# Patient Record
Sex: Female | Born: 1990 | Race: White | Hispanic: No | Marital: Married | Smoking: Never smoker
Health system: Southern US, Community
[De-identification: ages and names within clinical notes are randomized; demographics above are authoritative.]

## PROBLEM LIST (undated history)

## (undated) DIAGNOSIS — M75100 Unspecified rotator cuff tear or rupture of unspecified shoulder, not specified as traumatic: Secondary | ICD-10-CM

## (undated) DIAGNOSIS — D693 Immune thrombocytopenic purpura: Secondary | ICD-10-CM

## (undated) HISTORY — PX: WISDOM TOOTH EXTRACTION: SHX21

---
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
## 2008-04-04 ENCOUNTER — Ambulatory Visit: Payer: Self-pay | Admitting: Orthopedic Surgery

## 2008-04-04 ENCOUNTER — Telehealth: Payer: Self-pay | Admitting: Orthopedic Surgery

## 2008-04-04 DIAGNOSIS — M549 Dorsalgia, unspecified: Secondary | ICD-10-CM | POA: Insufficient documentation

## 2008-04-04 DIAGNOSIS — M5430 Sciatica, unspecified side: Secondary | ICD-10-CM

## 2008-04-04 DIAGNOSIS — IMO0002 Reserved for concepts with insufficient information to code with codable children: Secondary | ICD-10-CM

## 2008-04-10 ENCOUNTER — Encounter (HOSPITAL_COMMUNITY): Admission: RE | Admit: 2008-04-10 | Discharge: 2008-05-10 | Payer: Self-pay | Admitting: Orthopedic Surgery

## 2008-04-10 ENCOUNTER — Encounter: Payer: Self-pay | Admitting: Orthopedic Surgery

## 2008-05-11 ENCOUNTER — Encounter (HOSPITAL_COMMUNITY): Admission: RE | Admit: 2008-05-11 | Discharge: 2008-05-18 | Payer: Self-pay | Admitting: Orthopedic Surgery

## 2008-05-17 ENCOUNTER — Encounter: Payer: Self-pay | Admitting: Orthopedic Surgery

## 2008-08-01 ENCOUNTER — Ambulatory Visit (HOSPITAL_COMMUNITY): Admission: RE | Admit: 2008-08-01 | Discharge: 2008-08-01 | Payer: Self-pay | Admitting: Pulmonary Disease

## 2008-09-22 ENCOUNTER — Emergency Department (HOSPITAL_COMMUNITY): Admission: EM | Admit: 2008-09-22 | Discharge: 2008-09-23 | Payer: Self-pay | Admitting: Emergency Medicine

## 2008-11-01 ENCOUNTER — Ambulatory Visit (HOSPITAL_COMMUNITY): Admission: RE | Admit: 2008-11-01 | Discharge: 2008-11-01 | Payer: Self-pay | Admitting: Pulmonary Disease

## 2008-11-08 ENCOUNTER — Encounter (INDEPENDENT_AMBULATORY_CARE_PROVIDER_SITE_OTHER): Payer: Self-pay | Admitting: Pulmonary Disease

## 2008-11-08 ENCOUNTER — Ambulatory Visit (HOSPITAL_COMMUNITY): Admission: RE | Admit: 2008-11-08 | Discharge: 2008-11-08 | Payer: Self-pay | Admitting: Pulmonary Disease

## 2008-11-08 ENCOUNTER — Ambulatory Visit: Payer: Self-pay | Admitting: Cardiology

## 2009-04-15 ENCOUNTER — Ambulatory Visit (HOSPITAL_COMMUNITY): Admission: RE | Admit: 2009-04-15 | Discharge: 2009-04-15 | Payer: Self-pay | Admitting: Internal Medicine

## 2009-07-07 ENCOUNTER — Emergency Department (HOSPITAL_COMMUNITY): Admission: EM | Admit: 2009-07-07 | Discharge: 2009-07-07 | Payer: Self-pay | Admitting: Emergency Medicine

## 2010-06-03 ENCOUNTER — Ambulatory Visit (INDEPENDENT_AMBULATORY_CARE_PROVIDER_SITE_OTHER): Payer: BC Managed Care – PPO | Admitting: Internal Medicine

## 2010-06-03 DIAGNOSIS — R112 Nausea with vomiting, unspecified: Secondary | ICD-10-CM

## 2010-06-03 DIAGNOSIS — R1031 Right lower quadrant pain: Secondary | ICD-10-CM

## 2010-06-03 DIAGNOSIS — R1032 Left lower quadrant pain: Secondary | ICD-10-CM

## 2010-06-05 ENCOUNTER — Other Ambulatory Visit (INDEPENDENT_AMBULATORY_CARE_PROVIDER_SITE_OTHER): Payer: Self-pay | Admitting: Internal Medicine

## 2010-06-05 DIAGNOSIS — R110 Nausea: Secondary | ICD-10-CM

## 2010-06-06 ENCOUNTER — Encounter (HOSPITAL_COMMUNITY)
Admission: RE | Admit: 2010-06-06 | Discharge: 2010-06-06 | Disposition: A | Discharge status: Home / Self Care | Payer: BC Managed Care – PPO | Source: Ambulatory Visit | Attending: Internal Medicine | Admitting: Internal Medicine

## 2010-06-06 ENCOUNTER — Encounter (HOSPITAL_COMMUNITY): Payer: Self-pay

## 2010-06-06 DIAGNOSIS — R109 Unspecified abdominal pain: Secondary | ICD-10-CM | POA: Insufficient documentation

## 2010-06-06 DIAGNOSIS — R110 Nausea: Secondary | ICD-10-CM | POA: Insufficient documentation

## 2010-06-06 MED ORDER — TECHNETIUM TC 99M MEBROFENIN IV KIT
5.0000 | PACK | Freq: Once | INTRAVENOUS | Status: AC | PRN
  Administered 2010-06-06: 4.67 via INTRAVENOUS

## 2010-07-05 LAB — BLOOD GAS, ARTERIAL
Bicarbonate: 24.7 mEq/L — ABNORMAL HIGH (ref 20.0–24.0)
O2 Saturation: 96.1 %
pCO2 arterial: 38.5 mmHg (ref 35.0–45.0)
pO2, Arterial: 63.2 mmHg — ABNORMAL LOW (ref 80.0–100.0)

## 2010-07-07 LAB — DIFFERENTIAL
Basophils Absolute: 0 10*3/uL (ref 0.0–0.1)
Basophils Relative: 0 % (ref 0–1)
Eosinophils Absolute: 0.1 10*3/uL (ref 0.0–0.7)
Neutrophils Relative %: 69 % (ref 43–77)

## 2010-07-07 LAB — BASIC METABOLIC PANEL
BUN: 15 mg/dL (ref 6–23)
Creatinine, Ser: 0.83 mg/dL (ref 0.4–1.2)
GFR calc Af Amer: >60 mL/min (ref >60)
Potassium: 3.4 mEq/L — ABNORMAL LOW (ref 3.5–5.1)
Sodium: 136 mEq/L (ref 135–145)

## 2010-07-07 LAB — CBC
MCHC: 36.3 g/dL — ABNORMAL HIGH (ref 30.0–36.0)
MCV: 90.2 fL (ref 78.0–100.0)
Platelets: 250 10*3/uL (ref 150–400)
RDW: 12 % (ref 11.5–15.5)

## 2010-07-07 LAB — D-DIMER, QUANTITATIVE: D-Dimer, Quant: 0.63 ug/mL-FEU — ABNORMAL HIGH (ref 0.00–0.48)

## 2010-08-12 NOTE — Procedures (Signed)
NAMELEORA, PLATT             ACCOUNT NO.:  [id number]   MEDICAL RECORD NO.:  [id number]          PATIENT TYPE:  OUT   LOCATION:  RESP                          FACILITY:  [facility]   PHYSICIAN:  Edward L. Juanetta Gosling, M.D.DATE OF BIRTH:  February 02, 1991   DATE OF PROCEDURE:  DATE OF DISCHARGE:  11/01/2008                            PULMONARY FUNCTION TEST   1. Spirometry is normal.  2. Lung volumes are normal.  3. DLCO is normal.  4. Arterial blood gas shows relative resting hypoxemia.      Edward L. Juanetta Gosling, M.D.  Electronically Signed     ELH/MEDQ  D:  11/02/2008  T:  11/02/2008  Job:  [id number]

## 2010-08-15 NOTE — Op Note (Signed)
NAME:  Gina Vasquez, Gina Vasquez                       ACCOUNT NO.:  [id number]   MEDICAL RECORD NO.:  [id number]                   PATIENT TYPE:  AMB   LOCATION:  DSC                                  FACILITY:  [facility]   PHYSICIAN:  Loreta Ave, M.D.              DATE OF BIRTH:  1990/10/13   DATE OF PROCEDURE:  07/05/2003  DATE OF DISCHARGE:                                 OPERATIVE REPORT   PREOPERATIVE DIAGNOSIS:  Symptomatic ganglion, dorsal aspect of the right  wrist.   POSTOPERATIVE DIAGNOSIS:  Symptomatic ganglion, dorsal aspect of the right  wrist, being subretinacular fourth dorsal compartment.   PROCEDURE:  Excision ganglion dorsal aspect of the right wrist.   SURGEON:  Loreta Ave, M.D.   ASSISTANT:  Arlys John D. Petrarca, P.A.-C.   ANESTHESIA:  General.   ESTIMATED BLOOD LOSS:  Minimal.   TOURNIQUET TIME:  30 minutes.   SPECIMENS:  None.   CULTURES:  None.   COMPLICATIONS:  None.   DRESSINGS:  Soft compressive with bulky dressing and splint.   PROCEDURE:  The patient was brought to the operating room and after adequate  anesthesia had been obtained, the tourniquet was applied to the upper aspect  of the right arm.  Prepped and draped in the usual sterile fashion.  Exsanguinated with elevation of Esmarch and the tourniquet inflated to 250  mmHg.  A transverse incision over the ganglion over the dorsal aspect of the  wrist fourth dorsal compartment.  The skin and subcutaneous tissues were  divided avoiding injury to the dorsal branch of the radial nerve.  The  retinaculum was opened transversely exposing the ganglion below this in the  fourth compartment.  The extensor tendon was protected.  The ganglion was  excised in its entirety including a window off the dorsal wrist capsule  where it was emanating from just on the dorsal aspect of the lunate.  A  little superficial erosion into the lunate from pressure on the ganglion.  Excised in its entirety.  Joint  explored and no degenerative changes.  No  satellite lesions or other ganglion seen.  The wound was irrigated.  One  stitch placed in the retinaculum to bring this into approximation but it had  been left intact distal to the excision.  The  skin was closed with nylon.  The margins of the wound were injected with  Marcaine.  Sterile compressive dressing applied with a splint.  Anesthesia  reversed.  Brought to the recovery room.  Tolerated the surgery well without  complications.                                               Loreta Ave, M.D.    DFM/MEDQ  D:  07/05/2003  T:  07/05/2003  Job:  [id number] 

## 2011-04-14 ENCOUNTER — Other Ambulatory Visit (HOSPITAL_COMMUNITY): Payer: Self-pay | Admitting: Pulmonary Disease

## 2011-04-14 ENCOUNTER — Ambulatory Visit (HOSPITAL_COMMUNITY)
Admission: RE | Admit: 2011-04-14 | Discharge: 2011-04-14 | Disposition: A | Discharge status: Home / Self Care | Payer: PRIVATE HEALTH INSURANCE | Source: Ambulatory Visit | Attending: Pulmonary Disease | Admitting: Pulmonary Disease

## 2011-04-14 DIAGNOSIS — S4980XA Other specified injuries of shoulder and upper arm, unspecified arm, initial encounter: Secondary | ICD-10-CM | POA: Insufficient documentation

## 2011-04-14 DIAGNOSIS — M25519 Pain in unspecified shoulder: Secondary | ICD-10-CM | POA: Insufficient documentation

## 2011-04-14 DIAGNOSIS — M25512 Pain in left shoulder: Secondary | ICD-10-CM

## 2011-04-14 DIAGNOSIS — R937 Abnormal findings on diagnostic imaging of other parts of musculoskeletal system: Secondary | ICD-10-CM | POA: Insufficient documentation

## 2011-04-14 DIAGNOSIS — S46909A Unspecified injury of unspecified muscle, fascia and tendon at shoulder and upper arm level, unspecified arm, initial encounter: Secondary | ICD-10-CM | POA: Insufficient documentation

## 2011-04-14 DIAGNOSIS — X58XXXA Exposure to other specified factors, initial encounter: Secondary | ICD-10-CM | POA: Insufficient documentation

## 2011-04-18 IMAGING — CR DG CHEST 2V
2 series · 2 of 2 positions shown · non-contrast
Comparison: None

CLINICAL DATA: Shortness of breath.

CHEST - 2 VIEW

[view not recorded (1 of 2)]
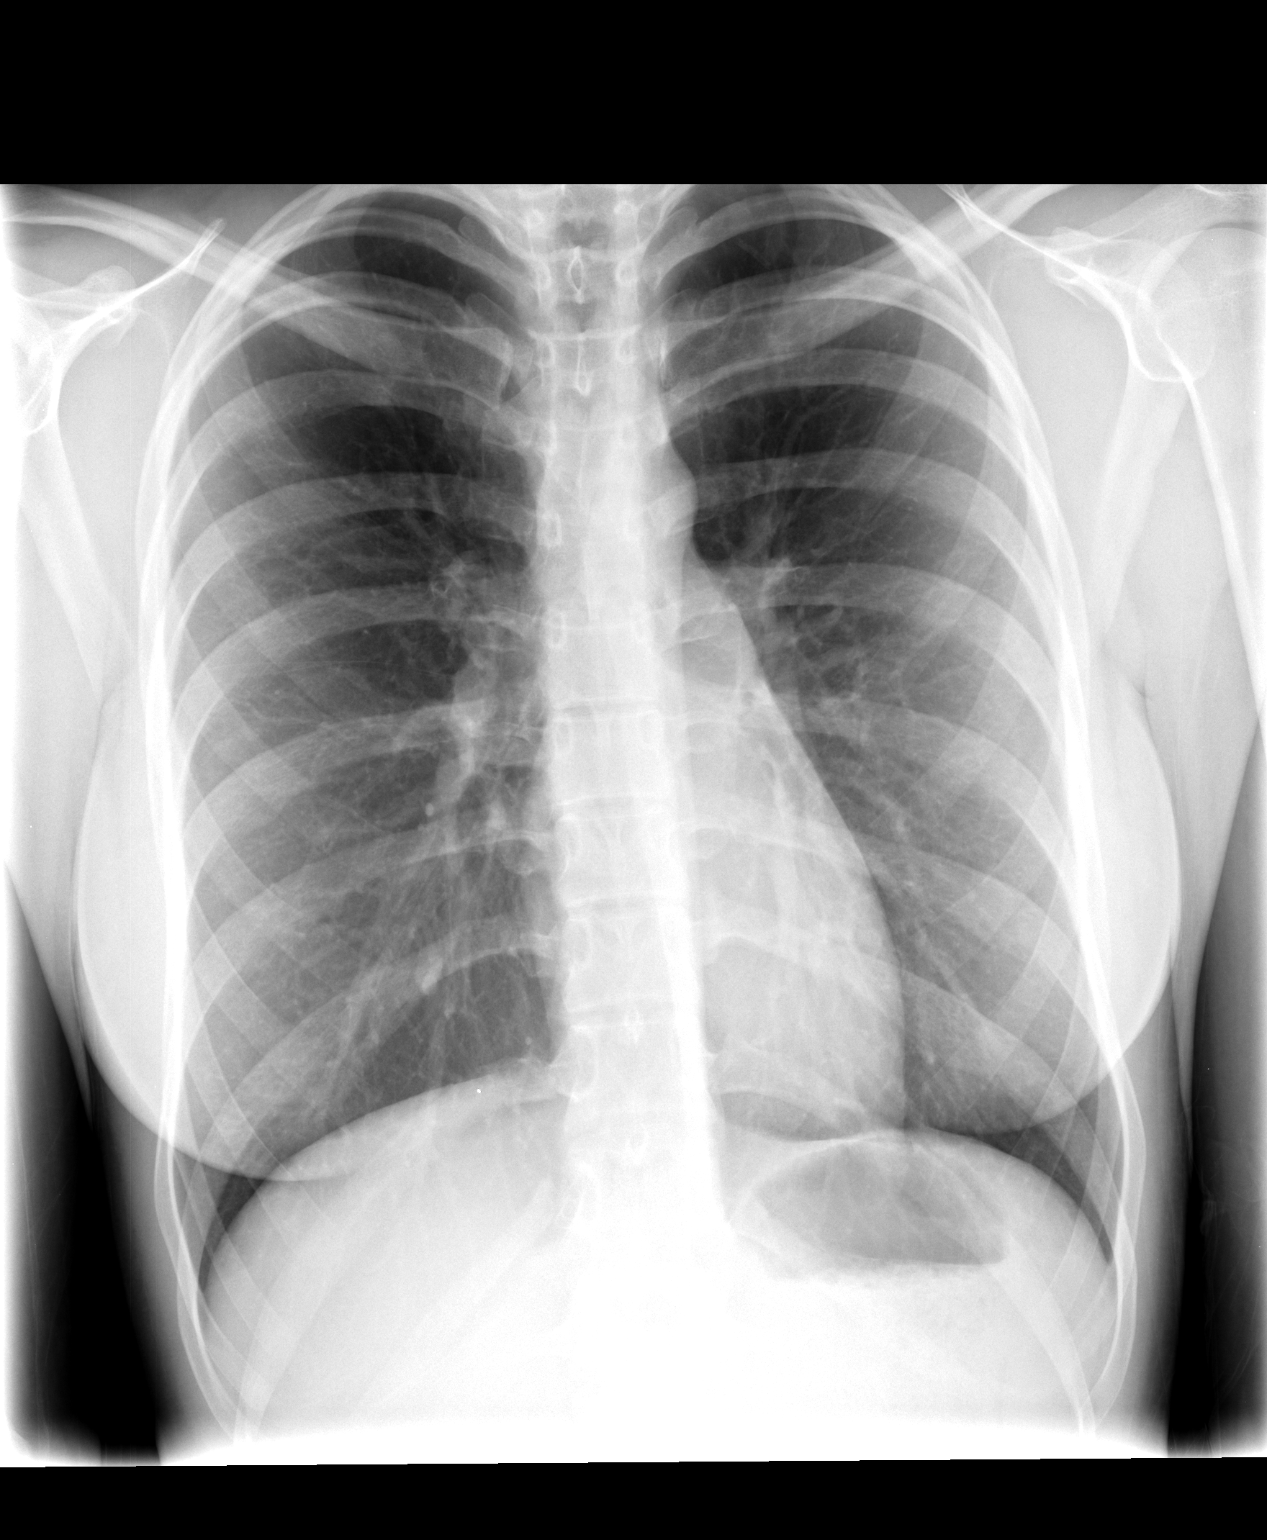

[view not recorded (2 of 2)]
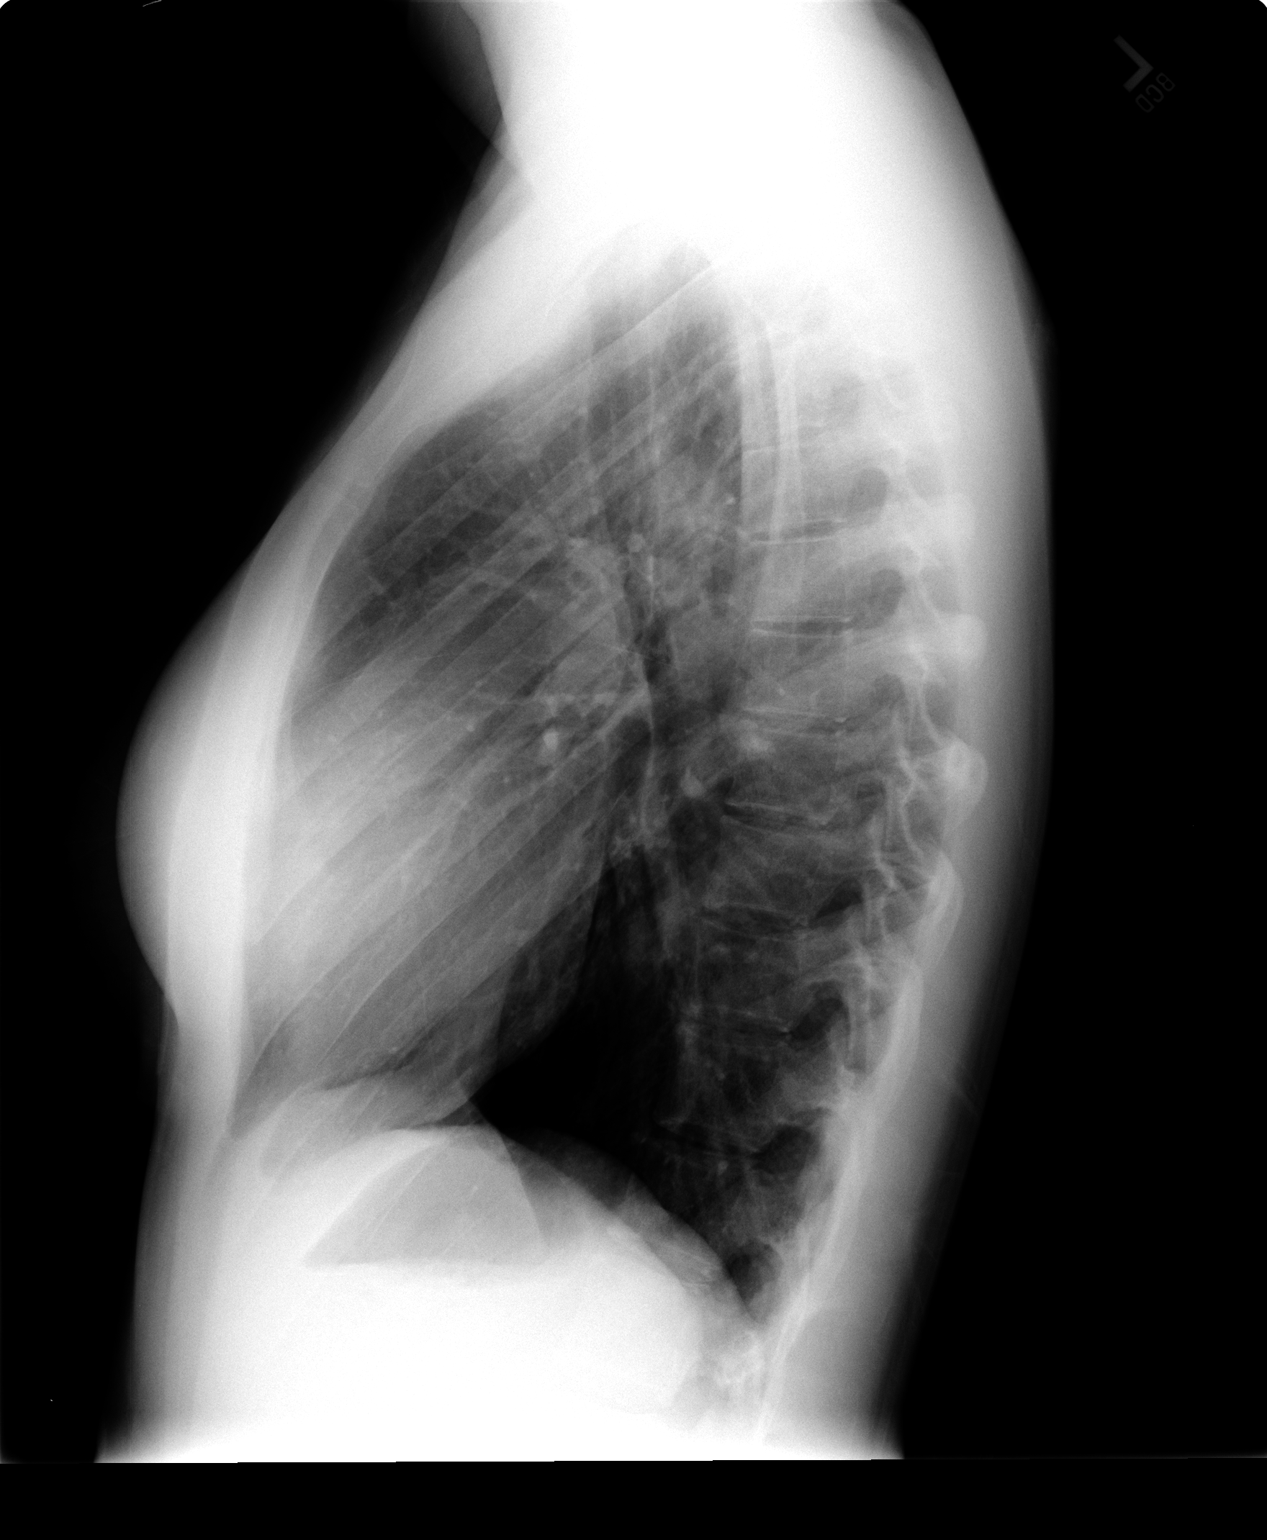

[2 of 2 positions shown; findings below may reference images not displayed]

FINDINGS: Heart and mediastinal contours are within normal limits.
No focal opacities or effusions.  No acute bony abnormality.
IMPRESSION: No active disease.

## 2011-05-01 DIAGNOSIS — M75100 Unspecified rotator cuff tear or rupture of unspecified shoulder, not specified as traumatic: Secondary | ICD-10-CM

## 2011-05-01 HISTORY — DX: Unspecified rotator cuff tear or rupture of unspecified shoulder, not specified as traumatic: M75.100

## 2011-05-13 ENCOUNTER — Ambulatory Visit: Payer: Self-pay | Admitting: General Practice

## 2011-05-22 ENCOUNTER — Encounter (HOSPITAL_BASED_OUTPATIENT_CLINIC_OR_DEPARTMENT_OTHER): Payer: Self-pay | Admitting: *Deleted

## 2011-05-27 NOTE — H&P (Signed)
[facility] [address]   [address] [place], [place] [address] [phone] A Division of [facility]  Loreta Ave, M.D.     Robert A. Thurston Hole, M.D.     Lunette Stands, M.D. Eulas Post, M.D.    Buford Dresser, M.D. Estell Harpin, M.D. Ralene Cork, D.O.          Genene Churn. Barry Dienes, PA-C            Kirstin A. Shepperson, PA-C Avilla, OPA-C   RE: Tanja, Gift                                [id number]      DOB: 08-07-1990 PROGRESS NOTE: 05-19-11 Enid comes in for evaluation and treatment recommendation for her left shoulder.  This is a new injury that occurred while she was sledding in December.  She fell and five people fell on top of her.  Her arm was wrenched, she doesn't know what position.  She felt something pop, it hurt and her shoulder has bothered her ever since.  We had seen her for some left shoulder and arm pain back in June of 2012.  At that time this was felt to be cervical radiculitis and no real issue with her shoulder.  Since this new injury she has had x-rays obtained, as well as an MRI at [facility].  I have accessed the MRI and report, but I have not been able to see the plain films.  Her symptoms are mechanical in nature with some feeling of instability.  She feels like something gets out of place and she can't get her shoulder reduced right.  Pain with all activities.  Having difficulty with activities of daily living and work because of continued symptoms.  She occasionally gets some numbness into her fourth and fifth finger, but that is very secondary.  It is the mechanical symptoms in her shoulder that are the most annoying.  Her MRI revealed a pattern of an anterior superior labral tear.  I have looked at that and there is also a question whether this may represent a small Bankart lesion not in the classic anteroinferior quadrant, but in the middle and anterior aspect.  Her  cuff is intact.  Biceps is intact.  All other structures look good.   Entire history is reviewed, updated and included in the chart.  EXAMINATION: General exam is outlined and included in the chart.  She has normal laxity.  She is neurovascularly intact both upper extremities.  Good cervical motion.  Her left shoulder has full motion.  I am really not getting much in the way of cuff irritation or AC irritation.  She definitely has positive anterior labrum signs, as well as a little bit of anterior apprehension.  No sulcus sign.  Stable posteriorly.  I can get her through full motion active and passive.  Opposite right shoulder has full motion.  No irritation.    X-RAYS: Three view x-ray shows a Type I acromion.  Normal AC joint.  Glenohumeral joint looks good.   DISPOSITION:  Traumatic injury anterior labrum tear, left shoulder.  Question of an instability event, possible Bankart lesion.  Persistent symptoms now going on for more than two months.  Getting worse.  MRI evidence of at least a labrum tear.  Discussed definitive treatment.  Exam under anesthesia, arthroscopy, assessment of injury.  Possible debridement of her labrum and possible reconstruction depending on findings.  What is involved with the procedure, risks, benefits and complications reviewed in detail.  Paperwork complete.  Her time out of work is really going to depend on what we find and what we have to do.  She understands.  See her at the time of operative intervention.    Loreta Ave, M.D.

## 2011-05-28 ENCOUNTER — Encounter (HOSPITAL_BASED_OUTPATIENT_CLINIC_OR_DEPARTMENT_OTHER): Payer: Self-pay | Admitting: Anesthesiology

## 2011-05-28 ENCOUNTER — Ambulatory Visit (HOSPITAL_BASED_OUTPATIENT_CLINIC_OR_DEPARTMENT_OTHER): Payer: PRIVATE HEALTH INSURANCE | Admitting: Anesthesiology

## 2011-05-28 ENCOUNTER — Encounter (HOSPITAL_BASED_OUTPATIENT_CLINIC_OR_DEPARTMENT_OTHER)
Admission: RE | Disposition: A | Discharge status: Home / Self Care | Payer: Self-pay | Source: Ambulatory Visit | Attending: Orthopedic Surgery

## 2011-05-28 ENCOUNTER — Ambulatory Visit (HOSPITAL_BASED_OUTPATIENT_CLINIC_OR_DEPARTMENT_OTHER)
Admission: RE | Admit: 2011-05-28 | Discharge: 2011-05-28 | Disposition: A | Discharge status: Home / Self Care | Payer: PRIVATE HEALTH INSURANCE | Source: Ambulatory Visit | Attending: Orthopedic Surgery | Admitting: Orthopedic Surgery

## 2011-05-28 DIAGNOSIS — S43499A Other sprain of unspecified shoulder joint, initial encounter: Secondary | ICD-10-CM | POA: Insufficient documentation

## 2011-05-28 DIAGNOSIS — W208XXA Other cause of strike by thrown, projected or falling object, initial encounter: Secondary | ICD-10-CM | POA: Insufficient documentation

## 2011-05-28 DIAGNOSIS — Y9323 Activity, snow (alpine) (downhill) skiing, snow boarding, sledding, tobogganing and snow tubing: Secondary | ICD-10-CM | POA: Insufficient documentation

## 2011-05-28 DIAGNOSIS — Z4789 Encounter for other orthopedic aftercare: Secondary | ICD-10-CM

## 2011-05-28 DIAGNOSIS — Y929 Unspecified place or not applicable: Secondary | ICD-10-CM | POA: Insufficient documentation

## 2011-05-28 HISTORY — DX: Unspecified rotator cuff tear or rupture of unspecified shoulder, not specified as traumatic: M75.100

## 2011-05-28 SURGERY — SHOULDER ARTHROSCOPY WITH BANKART REPAIR
Anesthesia: General | Site: Shoulder | Laterality: Left | Wound class: Clean

## 2011-05-28 MED ORDER — BUPIVACAINE-EPINEPHRINE PF 0.5-1:200000 % IJ SOLN
INTRAMUSCULAR | Status: DC | PRN
  Administered 2011-05-28: 15 mL

## 2011-05-28 MED ORDER — LACTATED RINGERS IV SOLN
INTRAVENOUS | Status: DC
  Administered 2011-05-28 (×2): via INTRAVENOUS

## 2011-05-28 MED ORDER — PROPOFOL 10 MG/ML IV EMUL
INTRAVENOUS | Status: DC | PRN
  Administered 2011-05-28: 200 mg via INTRAVENOUS

## 2011-05-28 MED ORDER — SUCCINYLCHOLINE CHLORIDE 20 MG/ML IJ SOLN
INTRAMUSCULAR | Status: DC | PRN
  Administered 2011-05-28: 100 mg via INTRAVENOUS

## 2011-05-28 MED ORDER — SODIUM CHLORIDE 0.9 % IR SOLN
Status: DC | PRN
  Administered 2011-05-28: 3000 mL

## 2011-05-28 MED ORDER — HYDROMORPHONE HCL PF 1 MG/ML IJ SOLN: 0.2500 mg | INTRAMUSCULAR | Status: DC | PRN

## 2011-05-28 MED ORDER — MIDAZOLAM HCL 2 MG/2ML IJ SOLN
0.5000 mg | INTRAMUSCULAR | Status: DC | PRN
  Administered 2011-05-28: 2 mg via INTRAVENOUS

## 2011-05-28 MED ORDER — MIDAZOLAM HCL 5 MG/5ML IJ SOLN
INTRAMUSCULAR | Status: DC | PRN
  Administered 2011-05-28: 1 mg via INTRAVENOUS

## 2011-05-28 MED ORDER — LIDOCAINE HCL (CARDIAC) 20 MG/ML IV SOLN
INTRAVENOUS | Status: DC | PRN
  Administered 2011-05-28: 50 mg via INTRAVENOUS

## 2011-05-28 MED ORDER — DEXAMETHASONE SODIUM PHOSPHATE 4 MG/ML IJ SOLN
INTRAMUSCULAR | Status: DC | PRN
  Administered 2011-05-28: 10 mg via INTRAVENOUS

## 2011-05-28 MED ORDER — CEFAZOLIN SODIUM 1-5 GM-% IV SOLN
1.0000 g | INTRAVENOUS | Status: AC
  Administered 2011-05-28: 1 g via INTRAVENOUS

## 2011-05-28 MED ORDER — FENTANYL CITRATE 0.05 MG/ML IJ SOLN
50.0000 ug | INTRAMUSCULAR | Status: DC | PRN
  Administered 2011-05-28: 50 ug via INTRAVENOUS

## 2011-05-28 MED ORDER — MORPHINE SULFATE 2 MG/ML IJ SOLN: 0.0500 mg/kg | INTRAMUSCULAR | Status: DC | PRN

## 2011-05-28 MED ORDER — METOCLOPRAMIDE HCL 5 MG/ML IJ SOLN: 10.0000 mg | Freq: Once | INTRAMUSCULAR | Status: DC | PRN

## 2011-05-28 MED ORDER — BUPIVACAINE HCL (PF) 0.25 % IJ SOLN
INTRAMUSCULAR | Status: DC | PRN
  Administered 2011-05-28: 20 mL

## 2011-05-28 SURGICAL SUPPLY — 88 items
ANCH SUT PUSHLCK 19.5X3.5 STRL (Anchor) ×3 IMPLANT
ANCHOR PUSHLOCK PEEK 3.5X19.5 (Anchor) ×6 IMPLANT
APL SKNCLS STERI-STRIP NONHPOA (GAUZE/BANDAGES/DRESSINGS)
BENZOIN TINCTURE PRP APPL 2/3 (GAUZE/BANDAGES/DRESSINGS) IMPLANT
BIT DRILL TAK (DRILL) IMPLANT
BLADE CUTTER GATOR 3.5 (BLADE) ×2 IMPLANT
BLADE CUTTER MENIS 5.5 (BLADE) IMPLANT
BLADE GREAT WHITE 4.2 (BLADE) ×2 IMPLANT
BLADE MINI RND TIP GREEN BEAV (BLADE) IMPLANT
BLADE SURG 15 STRL LF DISP TIS (BLADE) IMPLANT
BLADE SURG 15 STRL SS (BLADE)
BUR OVAL 6.0 (BURR) ×2 IMPLANT
CANISTER OMNI JUG 16 LITER (MISCELLANEOUS) ×2 IMPLANT
CANISTER SUCTION 2500CC (MISCELLANEOUS) IMPLANT
CANNULA 5.75X71 LONG (CANNULA) IMPLANT
CANNULA TWIST IN 8.25X7CM (CANNULA) IMPLANT
CANNULA TWIST IN 8.25X9CM (CANNULA) IMPLANT
CLOTH BEACON ORANGE TIMEOUT ST (SAFETY) ×2 IMPLANT
DECANTER SPIKE VIAL GLASS SM (MISCELLANEOUS) IMPLANT
DRAPE STERI 35X30 U-POUCH (DRAPES) ×2 IMPLANT
DRAPE U-SHAPE 47X51 STRL (DRAPES) ×2 IMPLANT
DRAPE U-SHAPE 76X120 STRL (DRAPES) ×4 IMPLANT
DRILL TAK (DRILL)
DRSG PAD ABDOMINAL 8X10 ST (GAUZE/BANDAGES/DRESSINGS) ×2 IMPLANT
DRSG XEROFORM 1X8 (GAUZE/BANDAGES/DRESSINGS) ×1 IMPLANT
DURAPREP 26ML APPLICATOR (WOUND CARE) ×2 IMPLANT
ELECT MENISCUS 165MM 90D (ELECTRODE) ×2 IMPLANT
ELECT REM PT RETURN 9FT ADLT (ELECTROSURGICAL) ×2
ELECTRODE REM PT RTRN 9FT ADLT (ELECTROSURGICAL) ×1 IMPLANT
FIBERSTICK 2 (SUTURE) IMPLANT
GAUZE SPONGE 4X4 16PLY XRAY LF (GAUZE/BANDAGES/DRESSINGS) IMPLANT
GAUZE XEROFORM 1X8 LF (GAUZE/BANDAGES/DRESSINGS) ×2 IMPLANT
GLOVE BIO SURGEON STRL SZ7 (GLOVE) ×1 IMPLANT
GLOVE BIO SURGEON STRL SZ7.5 (GLOVE) ×1 IMPLANT
GLOVE BIOGEL PI IND STRL 7.5 (GLOVE) IMPLANT
GLOVE BIOGEL PI IND STRL 8 (GLOVE) ×1 IMPLANT
GLOVE BIOGEL PI INDICATOR 7.5 (GLOVE) ×1
GLOVE BIOGEL PI INDICATOR 8 (GLOVE) ×1
GLOVE INDICATOR 7.0 STRL GRN (GLOVE) ×1 IMPLANT
GLOVE ORTHO TXT STRL SZ7.5 (GLOVE) ×4 IMPLANT
GOWN BRE IMP PREV XXLGXLNG (GOWN DISPOSABLE) ×2 IMPLANT
GOWN PREVENTION PLUS XLARGE (GOWN DISPOSABLE) ×2 IMPLANT
IMMOBILIZER SHOULDER XLGE (ORTHOPEDIC SUPPLIES) IMPLANT
LASSO SUT 90 DEGREE (SUTURE) IMPLANT
LOOP 2 FIBERLINK CLOSED (SUTURE) IMPLANT
NDL SUT 6 .5 CRC .975X.05 MAYO (NEEDLE) IMPLANT
NEEDLE MAYO TAPER (NEEDLE)
NS IRRIG 1000ML POUR BTL (IV SOLUTION) IMPLANT
PACK ARTHROSCOPY DSU (CUSTOM PROCEDURE TRAY) ×2 IMPLANT
PACK BASIN DAY SURGERY FS (CUSTOM PROCEDURE TRAY) ×2 IMPLANT
PASSER SUT SWANSON 36MM LOOP (INSTRUMENTS) IMPLANT
PENCIL BUTTON HOLSTER BLD 10FT (ELECTRODE) ×2 IMPLANT
SET ARTHROSCOPY TUBING (MISCELLANEOUS) ×2
SET ARTHROSCOPY TUBING LN (MISCELLANEOUS) ×1 IMPLANT
SLEEVE SCD COMPRESS KNEE MED (MISCELLANEOUS) IMPLANT
SLING ARM FOAM STRAP LRG (SOFTGOODS) IMPLANT
SLING ARM FOAM STRAP MED (SOFTGOODS) IMPLANT
SLING ARM FOAM STRAP XLG (SOFTGOODS) IMPLANT
SLING ARM IMMOBILIZER LRG (SOFTGOODS) IMPLANT
SLING ARM IMMOBILIZER MED (SOFTGOODS) ×1 IMPLANT
SPEAR FASTAKII (SLEEVE) IMPLANT
SPONGE GAUZE 4X4 12PLY (GAUZE/BANDAGES/DRESSINGS) ×2 IMPLANT
SPONGE GAUZE 4X4 FOR O.R. (GAUZE/BANDAGES/DRESSINGS) ×1 IMPLANT
SPONGE LAP 4X18 X RAY DECT (DISPOSABLE) IMPLANT
STRIP CLOSURE SKIN 1/2X4 (GAUZE/BANDAGES/DRESSINGS) ×1 IMPLANT
SUCTION FRAZIER TIP 10 FR DISP (SUCTIONS) IMPLANT
SUT ETHIBOND 2 OS 4 DA (SUTURE) IMPLANT
SUT ETHILON 2 0 FS 18 (SUTURE) IMPLANT
SUT ETHILON 3 0 PS 1 (SUTURE) ×1 IMPLANT
SUT FIBERWIRE #2 38 T-5 BLUE (SUTURE)
SUT LASSO 45 DEGREE (SUTURE) ×1 IMPLANT
SUT LASSO 45 DEGREE LEFT (SUTURE) IMPLANT
SUT LASSO 45D RIGHT (SUTURE) IMPLANT
SUT RETRIEVER MED (INSTRUMENTS) IMPLANT
SUT TIGER TAPE 7 IN WHITE (SUTURE) IMPLANT
SUT VIC AB 0 CT1 27 (SUTURE)
SUT VIC AB 0 CT1 27XBRD ANBCTR (SUTURE) IMPLANT
SUT VIC AB 2-0 SH 27 (SUTURE)
SUT VIC AB 2-0 SH 27XBRD (SUTURE) IMPLANT
SUT VIC AB 3-0 FS2 27 (SUTURE) IMPLANT
SUTURE FIBERWR #2 38 T-5 BLUE (SUTURE) IMPLANT
SYR BULB 3OZ (MISCELLANEOUS) IMPLANT
TAPE CLOTH SURG 4X10 WHT LF (GAUZE/BANDAGES/DRESSINGS) ×1 IMPLANT
TAPE FIBER 2MM 7IN #2 BLUE (SUTURE) IMPLANT
TOWEL OR 17X24 6PK STRL BLUE (TOWEL DISPOSABLE) ×2 IMPLANT
TUBE CONNECTING 20X1/4 (TUBING) ×1 IMPLANT
WATER STERILE IRR 1000ML POUR (IV SOLUTION) ×2 IMPLANT
YANKAUER SUCT BULB TIP NO VENT (SUCTIONS) IMPLANT

## 2011-05-28 NOTE — Anesthesia Postprocedure Evaluation (Signed)
Anesthesia Post Note  Patient: Gina Vasquez  Procedure(s) Performed: Procedure(s) (LRB): SHOULDER ARTHROSCOPY WITH BANKHARDT REPAIR (Left)  Anesthesia type: General  Patient location: PACU  Post pain: Pain level controlled  Post assessment: Patient's Cardiovascular Status Stable  Last Vitals:  Filed Vitals:   05/28/11 1345  BP: 115/73  Pulse: 71  Temp:   Resp: 12    Post vital signs: Reviewed and stable  Level of consciousness: alert  Complications: No apparent anesthesia complications

## 2011-05-28 NOTE — Anesthesia Preprocedure Evaluation (Signed)
Anesthesia Evaluation  Patient identified by MRN, date of birth, ID band Patient awake    Reviewed: Allergy & Precautions, H&P , NPO status , Patient's Chart, lab work & pertinent test results, reviewed documented beta blocker date and time   Airway Mallampati: II TM Distance: >3 FB Neck ROM: full    Dental   Pulmonary neg pulmonary ROS,          Cardiovascular neg cardio ROS     Neuro/Psych  Neuromuscular disease Negative Psych ROS   GI/Hepatic negative GI ROS, Neg liver ROS,   Endo/Other  Negative Endocrine ROS  Renal/GU negative Renal ROS  Genitourinary negative   Musculoskeletal   Abdominal   Peds  Hematology negative hematology ROS (+)   Anesthesia Other Findings See surgeon's H&P   Reproductive/Obstetrics negative OB ROS                           Anesthesia Physical Anesthesia Plan  ASA: I  Anesthesia Plan: General   Post-op Pain Management:    Induction: Intravenous  Airway Management Planned: Oral ETT  Additional Equipment:   Intra-op Plan:   Post-operative Plan: Extubation in OR  Informed Consent: I have reviewed the patients History and Physical, chart, labs and discussed the procedure including the risks, benefits and alternatives for the proposed anesthesia with the patient or authorized representative who has indicated his/her understanding and acceptance.     Plan Discussed with: CRNA and Surgeon  Anesthesia Plan Comments:         Anesthesia Quick Evaluation  

## 2011-05-28 NOTE — Anesthesia Procedure Notes (Addendum)
Anesthesia Regional Block:  Interscalene brachial plexus block  Pre-Anesthetic Checklist: ,, timeout performed, Correct Patient, Correct Site, Correct Laterality, Correct Procedure, Correct Position, site marked, Risks and benefits discussed,  Surgical consent,  Pre-op evaluation,  At surgeon's request and post-op pain management  Laterality: Left  Prep: chloraprep       Needles:   Needle Type: Other   (Arrow Echogenic)   Needle Length: 9cm  Needle Gauge: 21    Additional Needles:  Procedures: ultrasound guided Interscalene brachial plexus block Narrative:  Start time: 05/28/2011 10:22 AM End time: 05/28/2011 10:28 AM Injection made incrementally with aspirations every 5 mL.  Performed by: Personally  Anesthesiologist: Aldona Lento, MD  Additional Notes: Ultrasound guidance used to: id relevant anatomy, confirm needle position, local anesthetic spread, avoidance of vascular puncture. Picture saved. No complications. Block performed personally by Janetta Hora. Gelene Mink, MD    Interscalene brachial plexus block Procedure Name: Intubation Date/Time: 05/28/2011 10:44 AM Performed by: Gladys Damme Pre-anesthesia Checklist: Patient identified, Emergency Drugs available, Suction available and Patient being monitored Patient Re-evaluated:Patient Re-evaluated prior to inductionOxygen Delivery Method: Circle System Utilized Preoxygenation: Pre-oxygenation with 100% oxygen Intubation Type: IV induction Ventilation: Mask ventilation without difficulty Laryngoscope Size: Miller and 2 Tube type: Oral Number of attempts: 1 Airway Equipment and Method: stylet Placement Confirmation: ETT inserted through vocal cords under direct vision,  positive ETCO2 and breath sounds checked- equal and bilateral Secured at: 22 cm Tube secured with: Tape Dental Injury: Teeth and Oropharynx as per pre-operative assessment

## 2011-05-28 NOTE — Transfer of Care (Signed)
Immediate Anesthesia Transfer of Care Note  Patient: Gina Vasquez  Procedure(s) Performed: Procedure(s) (LRB): SHOULDER ARTHROSCOPY WITH BANKHARDT REPAIR (Left)  Patient Location: PACU  Anesthesia Type: General  Level of Consciousness: awake, alert  and oriented  Airway & Oxygen Therapy: Patient Spontanous Breathing and Patient connected to face mask oxygen  Post-op Assessment: Report given to PACU RN and Post -op Vital signs reviewed and stable  Post vital signs: Reviewed and stable  Complications: No apparent anesthesia complications

## 2011-05-28 NOTE — Interval H&P Note (Signed)
History and Physical Interval Note:  05/28/2011 7:26 AM  Gina Vasquez  has presented today for surgery, with the diagnosis of left shoulder rc tear  The various methods of treatment have been discussed with the patient and family. After consideration of risks, benefits and other options for treatment, the patient has consented to  Procedure(s) (LRB): SHOULDER ARTHROSCOPY WITH [facility] REPAIR (Left) as a surgical intervention .  The patients' history has been reviewed, patient examined, no change in status, stable for surgery.  I have reviewed the patients' chart and labs.  Questions were answered to the patient's satisfaction.     Mykeisha Dysert F

## 2011-05-28 NOTE — Brief Op Note (Signed)
05/28/2011  11:58 AM  PATIENT:  Gina Vasquez  21 y.o. female  PRE-OPERATIVE DIAGNOSIS:  left shoulder rc tear  POST-OPERATIVE DIAGNOSIS:  left shoulder rc tear  PROCEDURE:  Procedure(s) (LRB): SHOULDER ARTHROSCOPY WITH Nira Conn REPAIR (Left)  SURGEON:  Surgeon(s) and Role:    * Loreta Ave, MD - Primary  PHYSICIAN ASSISTANT: Zonia Kief M     ANESTHESIA:   general and paracervical block  EBL:  Total I/O In: 1600 [I.V.:1600] Out: -    SPECIMEN:  No Specimen  DISPOSITION OF SPECIMEN:  N/A  COUNTS:  YES  TOURNIQUET:  * No tourniquets in log *  PATIENT DISPOSITION:  PACU - hemodynamically stable.

## 2011-05-28 NOTE — Discharge Instructions (Signed)
Regional Anesthesia Blocks  1. Numbness or the inability to move the "blocked" extremity may last from 3-48 hours after placement. The length of time depends on the medication injected and your individual response to the medication. If the numbness is not going away after 48 hours, call your surgeon.  2. The extremity that is blocked will need to be protected until the numbness is gone and the  Strength has returned. Because you cannot feel it, you will need to take extra care to avoid injury. Because it may be weak, you may have difficulty moving it or using it. You may not know what position it is in without looking at it while the block is in effect.  3. For blocks in the legs and feet, returning to weight bearing and walking needs to be done carefully. You will need to wait until the numbness is entirely gone and the strength has returned. You should be able to move your leg and foot normally before you try and bear weight or walk. You will need someone to be with you when you first try to ensure you do not fall and possibly risk injury.  4. Bruising and tenderness at the needle site are common side effects and will resolve in a few days.  5. Persistent numbness or new problems with movement should be communicated to the surgeon or the [facility] [phone]). [facility]  [address] [place], [place] [address] [phone]   Post Anesthesia Home Care Instructions  Activity: Get plenty of rest for the remainder of the day. A responsible adult should stay with you for 24 hours following the procedure.  For the next 24 hours, DO NOT: -Drive a car -Advertising copywriter -Drink alcoholic beverages -Take any medication unless instructed by your physician -Make any legal decisions or sign important papers.  Meals: Start with liquid foods such as gelatin or soup. Progress to regular foods as tolerated. Avoid greasy, spicy, heavy foods. If nausea and/or  vomiting occur, drink only clear liquids until the nausea and/or vomiting subsides. Call your physician if vomiting continues.  Special Instructions/Symptoms: Your throat may feel dry or sore from the anesthesia or the breathing tube placed in your throat during surgery. If this causes discomfort, gargle with warm salt water. The discomfort should disappear within 24 hours.

## 2011-05-28 NOTE — Progress Notes (Signed)
Assisted Dr. Frederick with left, ultrasound guided, supraclavicular block. Side rails up, monitors on throughout procedure. See vital signs in flow sheet. Tolerated Procedure well. 

## 2011-05-28 NOTE — Interval H&P Note (Signed)
History and Physical Interval Note:  05/28/2011 7:27 AM  Gina Vasquez  has presented today for surgery, with the diagnosis of left shoulder rc tear  The various methods of treatment have been discussed with the patient and family. After consideration of risks, benefits and other options for treatment, the patient has consented to  Procedure(s) (LRB): SHOULDER ARTHROSCOPY WITH [facility] REPAIR (Left) as a surgical intervention .  The patients' history has been reviewed, patient examined, no change in status, stable for surgery.  I have reviewed the patients' chart and labs.  Questions were answered to the patient's satisfaction.     Sohrab Keelan F

## 2011-05-29 NOTE — Op Note (Signed)
NAME:  CODEE, BLOODWORTH                  ACCOUNT NO.:  MEDICAL RECORD NO.:  [id number]  LOCATION:                                 FACILITY:  PHYSICIAN:  Loreta Ave, M.D. DATE OF BIRTH:  [birth year]  DATE OF PROCEDURE:  05/28/2011 DATE OF DISCHARGE:                              OPERATIVE REPORT   PREOPERATIVE DIAGNOSIS:  Left shoulder traumatic injury, anterior labrum tear.  POSTOPERATIVE DIAGNOSES:  Left shoulder traumatic injury, anterior labrum tear with a capsular labral disruption anterior glenoid and moderate global instability most pronounced anteroinferior.  PROCEDURE:  Left shoulder exam under anesthesia, arthroscopy. Debridement of labrum.  Advancement and Bankart repair.  Reconstruction of the capsular labral interface anteriorly with FiberLink suture x2, PushLock anchors x2.  SURGEON:  Loreta Ave, MD  ASSISTANT:  Genene Churn. Barry Dienes, [place], present throughout the entire case, necessary for timely completion of procedure.  ANESTHESIA:  General.  BLOOD LOSS:  Minimal.  SPECIMENS:  None.  CULTURES:  None.  COMPLICATIONS:  None.  DRESSINGS:  Sterile compressive with shoulder immobilizer.  PROCEDURE:  The patient was brought to the operating room, placed on the operating table in supine position.  After adequate anesthesia had been obtained, both shoulders were examined.  On both sides, she has full motion, increased external rotation, some degree of ligamentous laxity. On the symptomatic left side, more of a sulcus sign, more anterior instability than on the left.  Stable posteriorly.  Placed in beach- chair position on a shoulder positioner, prepped and draped in usual sterile fashion.  Two portals, anterior and posterior.  Arthroscope was introduced.  Shoulder was distended and inspected.  Normal hiatus at the top of the glenoid anteriorly, but the labrum and capsular labral interface was torn from there down towards the bottom of the front of the  glenoid.  The torn labrum debrided.  Capsular labral interface was roughened and the front of the glenoid was roughened for repair.  Biceps tendon and anchor intact.  Remaining articular cartilage looked good with the exception of a small Hill-Sachs indentation posteriorly. Rotator cuff looked excellent.  I felt there was sufficient injury and instability to warrant repair and reconstruction of front.  The capsular labral interface was mobilized and captured at the 7 and 9 o'clock position with 2 FiberLink sutures.  These were advanced up and anchored in the glenoid at the 8 and 10 o'clock position with 2 PushLock anchors. Nice firm reconstruction and a more stable feeling of shoulder at completion.  All structures were reassessed.  I placed a cannula in the front to allow for reconstruction.  All instruments were removed. Wounds were irrigated and closed with nylon.  Sterile compressive dressing applied.  Shoulder immobilizer applied.  Anesthesia was reversed.  Brought to the recovery room.  Tolerated the surgery well. No complications.     Loreta Ave, M.D.     DFM/MEDQ  D:  05/28/2011  T:  05/29/2011  Job:  [id number]

## 2011-07-16 ENCOUNTER — Ambulatory Visit (HOSPITAL_COMMUNITY): Payer: PRIVATE HEALTH INSURANCE | Admitting: Specialist

## 2011-07-21 ENCOUNTER — Ambulatory Visit (HOSPITAL_COMMUNITY)
Admission: RE | Admit: 2011-07-21 | Discharge: 2011-07-21 | Disposition: A | Discharge status: Home / Self Care | Payer: PRIVATE HEALTH INSURANCE | Source: Ambulatory Visit | Attending: Orthopedic Surgery | Admitting: Orthopedic Surgery

## 2011-07-21 DIAGNOSIS — M25519 Pain in unspecified shoulder: Secondary | ICD-10-CM | POA: Insufficient documentation

## 2011-07-21 DIAGNOSIS — M6281 Muscle weakness (generalized): Secondary | ICD-10-CM | POA: Insufficient documentation

## 2011-07-21 DIAGNOSIS — M25619 Stiffness of unspecified shoulder, not elsewhere classified: Secondary | ICD-10-CM | POA: Insufficient documentation

## 2011-07-21 DIAGNOSIS — S43439A Superior glenoid labrum lesion of unspecified shoulder, initial encounter: Secondary | ICD-10-CM | POA: Insufficient documentation

## 2011-07-21 DIAGNOSIS — IMO0001 Reserved for inherently not codable concepts without codable children: Secondary | ICD-10-CM | POA: Insufficient documentation

## 2011-07-21 NOTE — Evaluation (Signed)
Occupational Therapy Evaluation  Patient Details  Name: Gina Vasquez MRN: [id number] Date of Birth: Jan 08, 1991  Today's Date: 07/21/2011 Time: 4098-1191 Time Calculation (min): 45 min OT Eval 1035-1055  Manual Therapy [id number] 20' Visit#: 1  of 36   Re-eval: 08/18/11  Assessment Diagnosis: Left Shoulder Scope with [employer] Surgical Date: 05/28/11 Next MD Visit: 08/17/11 Prior Therapy: no  Past Medical History:  Past Medical History  Diagnosis Date  . Rotator cuff tear 05/2011    left   Past Surgical History:  Past Surgical History  Procedure Date  . Wisdom tooth extraction   . Ganglion cyst excision 07/05/2003    right wrist    Subjective Symptoms/Limitations Symptoms: S:  I hurt my left shoulder sledding in December.  I thought it would get better, but it didnt. Limitations: History:  Gina Vasquez was sledding and fell off, landing on her left shoulder in late December.  Her pain did not subside and she consulted with Dr. Eulah Pont.  She had surgery on her left shoulder to repair the anterior labrum of the glenoid fossa.  Her surgery was on 05/28/11.  She wore a sling on her left arm x 6 weeks.  She has been referred to occupational therapy for evaluation and treatment following the [employer] protocol that can be found in the scanned portion of this medical record. Pain Assessment Currently in Pain?: Yes Pain Score:   3 Pain Location: Shoulder Pain Orientation: Left Pain Type: Acute pain  Precautions/Restrictions  Precautions Precautions: Shoulder (Please refer to protocol.  Do not combine Abduction and ER.) Precaution Comments: She is 7 weeks post op  Prior Functioning  Home Living Additional Comments: Lives with her family.  She is engage. 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  Enjoys hiking and disc golf  Assessment ADL/Vision/Perception ADL ADL Comments: R handed.  She is not  using her left arm with any activities above waist height.  Can not put hair in ponytail.  Cognition/Observation Observation/Other Assessments Observations: Holds left arm in guarded position Other Assessments: UEFI   Sensation/Coordination/Edema    Additional Assessments LUE AROM (degrees) LUE Overall AROM Comments: Assessed in supine Left Shoulder Flexion  0-170: 45 Degrees Left Shoulder ABduction 0-40: 25 Degrees Left Shoulder Internal Rotation  0-70: 70 Degrees Left Shoulder External Rotation  0-90: 0 Degrees LUE PROM (degrees) Left Shoulder Flexion  0-170: 70 Degrees Left Shoulder ABduction 0-40: 40 Degrees Left Shoulder Internal Rotation  0-70: 70 Degrees Left Shoulder External Rotation  0-90: 0 Degrees Palpation Palpation: Mod-max fascial restrictions in scapular and shoulder region.  Exercise/Treatments    Manual Therapy Manual Therapy: Myofascial release Myofascial Release: MFR and manual stretching to left scapular region and upper arm to decrease pain and increase mobility.  PROM into all shoulder ranges x 5 reps.  Occupational Therapy Assessment and Plan OT Assessment and Plan Clinical Impression Statement: A:  Patient presents with decreased AROM and strength and increased pain and restrictions s/p shoulder surgery.  These deficits are causing decreased I with B/IADLs, work, and leisure activities. Rehab Potential: Excellent OT Frequency: Min 3X/week OT Duration: Other (comment) (12 weeks) OT Treatment/Interventions: Self-care/ADL training;Therapeutic exercise;Manual therapy;Modalities;Therapeutic activities;Patient/family education OT Plan: Educated patient on dowel rod AAROM and towel slides with extra emphasis on ER with shoulder adducted.  Verbal and written instructions given.   Goals Short Term Goals Time to Complete Short Term Goals: Other (comment) (6 weeks) Short Term Goal 1: Patient  will be educated on HEP. Short Term Goal 2: Patient will increase  left shoulder PROM to [facility] for increased ability to put her hair in a ponytail. Short Term Goal 3: Patient will increase left shoulder strength to 3+/5 for increased ability to lift items into her closet. Short Term Goal 4: Patient will decrease pain in her left shoulder region to 2/10 while combing her hair. Short Term Goal 5: Patient will decrease fascial restrictions from mod-max to moderate in her left shoulder region. Long Term Goals Time to Complete Long Term Goals: 12 weeks Long Term Goal 1: Patient will return to prior level of I with all B/IADLs, work, and leisure activities.   Long Term Goal 2: Patient will increase left shoulder AROM to WNL for increased ability to put hair in a ponytail and reach into overhead cabinets. Long Term Goal 3: Patient will increase left shoulder strength to 5/5 for increased ability to lift patients at work. Long Term Goal 4: Patient will decrease pain in left shoulder to 1/10 when completing work activities. Long Term Goal 5: Patient will decrease fascial restrictions to minimal in her left shoulder region.  Problem List Patient Active Problem List  Diagnoses  . SCIATICA  . BACK PAIN  . BACK PAIN WITH RADICULOPATHY  . Pain in joint, shoulder region  . [name]  . Muscle weakness (generalized)    End of Session Activity Tolerance: Patient tolerated treatment well General Behavior During Session: [facility] for tasks performed Cognition: [facility] for tasks performed    Shirlean Mylar, OTR/L  07/21/2011, 12:57 PM  Physician Documentation Your signature is required to indicate approval of the treatment plan as stated above.  Please sign and either send electronically or make a copy of this report for your files and return this physician signed original.  Please mark one 1.__approve of plan  2. ___approve of plan with the following conditions.   ______________________________                                                           _____________________ Physician Signature                                                                                                             Date

## 2011-07-22 ENCOUNTER — Ambulatory Visit (HOSPITAL_COMMUNITY)
Admission: RE | Admit: 2011-07-22 | Discharge: 2011-07-22 | Disposition: A | Discharge status: Home / Self Care | Payer: PRIVATE HEALTH INSURANCE | Source: Ambulatory Visit | Attending: Orthopedic Surgery | Admitting: Orthopedic Surgery

## 2011-07-22 DIAGNOSIS — M6281 Muscle weakness (generalized): Secondary | ICD-10-CM

## 2011-07-22 DIAGNOSIS — S43439A Superior glenoid labrum lesion of unspecified shoulder, initial encounter: Secondary | ICD-10-CM

## 2011-07-22 DIAGNOSIS — M25519 Pain in unspecified shoulder: Secondary | ICD-10-CM

## 2011-07-22 NOTE — Progress Notes (Signed)
Occupational Therapy Treatment  Patient Details  Name: Gina Vasquez MRN: [id number] Date of Birth: 12-10-1990  Today's Date: 07/22/2011 Time: 7829-5621 Time Calculation (min): 41 min Manual Therapy 1035-1100 25' Therapeutic Exercise 1100-1116 16' Visit#: 2  of 36   Re-eval: 08/18/11    Subjective Symptoms/Limitations Symptoms: S:  It was pretty sore yesterday, but I know that I need to do it. Pain Assessment Currently in Pain?: Yes Pain Score:   3 Pain Location: Shoulder Pain Orientation: Left Pain Type: Acute pain  Precautions/Restrictions   all ER should be completed with shoulder adducted.   Exercise/Treatments Supine Protraction: PROM;AAROM;10 reps Horizontal ABduction: PROM;AAROM;10 reps External Rotation: PROM;AAROM;10 reps Internal Rotation: PROM;AAROM;10 reps Flexion: PROM;AAROM;10 reps ABduction: PROM;AAROM;10 reps Seated Elevation: 10 reps Extension: 10 reps Row: 10 reps Therapy Ball Flexion: 10 reps ABduction: 10 reps  Isometric Strengthening   Flexion: Supine;3X5" Extension: Supine;3X5" External Rotation: Supine;3X5" Internal Rotation: Supine;3X5" ABduction: Supine;3X5" ADduction: Supine;3X5"    Manual Therapy Manual Therapy: Myofascial release Myofascial Release: MFR and manual stretching to left scapular region, pectoral region, and upper arm to decrase pain and increase ROM.  1035-1100  Occupational Therapy Assessment and Plan OT Assessment and Plan Clinical Impression Statement: A:  External rotation PROM increased from 0 to 5 degrees, but is very limited and painful.   OT Plan: P:  Continue to increase all PROM to [facility] wtih extra emphasis on ER.      Goals Short Term Goals Time to Complete Short Term Goals: Other (comment) (6 weeks) Short Term Goal 1: Patient will be educated on HEP. Short Term Goal 2: Patient will increase left shoulder PROM to [facility] for increased ability to put her hair in a ponytail. Short Term Goal 3: Patient will  increase left shoulder strength to 3+/5 for increased ability to lift items into her closet. Short Term Goal 4: Patient will decrease pain in her left shoulder region to 2/10 while combing her hair. Short Term Goal 5: Patient will decrease fascial restrictions from mod-max to moderate in her left shoulder region. Long Term Goals Time to Complete Long Term Goals: 12 weeks Long Term Goal 1: Patient will return to prior level of I with all B/IADLs, work, and leisure activities.   Long Term Goal 2: Patient will increase left shoulder AROM to WNL for increased ability to put hair in a ponytail and reach into overhead cabinets. Long Term Goal 3: Patient will increase left shoulder strength to 5/5 for increased ability to lift patients at work. Long Term Goal 4: Patient will decrease pain in left shoulder to 1/10 when completing work activities. Long Term Goal 5: Patient will decrease fascial restrictions to minimal in her left shoulder region.  Problem List Patient Active Problem List  Diagnoses  . SCIATICA  . BACK PAIN  . BACK PAIN WITH RADICULOPATHY  . Pain in joint, shoulder region  . [name]  . Muscle weakness (generalized)    End of Session Activity Tolerance: Patient tolerated treatment well General Behavior During Session: [facility] for tasks performed Cognition: [facility] for tasks performed  GO No functional reporting required  Shirlean Mylar, OTR/L  07/22/2011, 1:38 PM

## 2011-07-28 ENCOUNTER — Ambulatory Visit (HOSPITAL_COMMUNITY)
Admission: RE | Admit: 2011-07-28 | Discharge: 2011-07-28 | Disposition: A | Discharge status: Home / Self Care | Payer: PRIVATE HEALTH INSURANCE | Source: Ambulatory Visit | Attending: Orthopedic Surgery | Admitting: Orthopedic Surgery

## 2011-07-28 DIAGNOSIS — M25519 Pain in unspecified shoulder: Secondary | ICD-10-CM

## 2011-07-28 DIAGNOSIS — M6281 Muscle weakness (generalized): Secondary | ICD-10-CM

## 2011-07-28 DIAGNOSIS — S43439A Superior glenoid labrum lesion of unspecified shoulder, initial encounter: Secondary | ICD-10-CM

## 2011-07-28 NOTE — Progress Notes (Signed)
Occupational Therapy Treatment  Patient Details  Name: JOEANNE ROBICHEAUX MRN: [id number] Date of Birth: 1991/01/12  Today's Date: 07/28/2011 Time: 7829-5621 Time Calculation (min): 47 min Manual Therapy 3086-5784 20' Therapeutic Exercises [phone] 103' Visit#: 3  of 36   Re-eval: 08/18/11    Subjective Symptoms/Limitations Symptoms: S:  I feel like I am using it more. Pain Assessment Currently in Pain?: No/denies Pain Score: 0-No pain  Precautions/Restrictions     Exercise/Treatments Supine Protraction: PROM;10 reps;AAROM;15 reps Horizontal ABduction: PROM;10 reps;AAROM;15 reps External Rotation: PROM;10 reps;AAROM;15 reps Internal Rotation: PROM;10 reps;AAROM;15 reps Flexion: PROM;10 reps;AAROM;15 reps ABduction: PROM;10 reps;AAROM;15 reps Seated Elevation: 15 reps Extension: 15 reps Row: 15 reps Therapy Ball Flexion: 15 reps ABduction: 15 reps ROM / Strengthening / Isometric Strengthening Thumb Tacks: 1' Prot/Ret//Elev/Dep: 1'      Manual Therapy Manual Therapy: Myofascial release Myofascial Release: MFR and manual stretching to left scapular region, pectoral region, and upper arm to decrease pain and increase ROM.  4132-4401  Good increase in flexion, abduction, and external rotation PROM this date.  Occupational Therapy Assessment and Plan OT Assessment and Plan Clinical Impression Statement: A:  Significant increase in external rotation this date.   OT Plan: P:  Attempt AROM in supine.   Goals Short Term Goals Time to Complete Short Term Goals: Other (comment) (6 weeks) Short Term Goal 1: Patient will be educated on HEP. Short Term Goal 1 Progress: Progressing toward goal Short Term Goal 2: Patient will increase left shoulder PROM to [facility] for increased ability to put her hair in a ponytail. Short Term Goal 2 Progress: Progressing toward goal Short Term Goal 3: Patient will increase left shoulder strength to 3+/5 for increased ability to lift items into  her closet. Short Term Goal 3 Progress: Progressing toward goal Short Term Goal 4: Patient will decrease pain in her left shoulder region to 2/10 while combing her hair. Short Term Goal 4 Progress: Progressing toward goal Short Term Goal 5: Patient will decrease fascial restrictions from mod-max to moderate in her left shoulder region. Short Term Goal 5 Progress: Progressing toward goal Long Term Goals Time to Complete Long Term Goals: 12 weeks Long Term Goal 1: Patient will return to prior level of I with all B/IADLs, work, and leisure activities.   Long Term Goal 1 Progress: Progressing toward goal Long Term Goal 2: Patient will increase left shoulder AROM to WNL for increased ability to put hair in a ponytail and reach into overhead cabinets. Long Term Goal 2 Progress: Progressing toward goal Long Term Goal 3: Patient will increase left shoulder strength to 5/5 for increased ability to lift patients at work. Long Term Goal 3 Progress: Progressing toward goal Long Term Goal 4: Patient will decrease pain in left shoulder to 1/10 when completing work activities. Long Term Goal 4 Progress: Progressing toward goal Long Term Goal 5: Patient will decrease fascial restrictions to minimal in her left shoulder region. Long Term Goal 5 Progress: Progressing toward goal  Problem List Patient Active Problem List  Diagnoses  . SCIATICA  . BACK PAIN  . BACK PAIN WITH RADICULOPATHY  . Pain in joint, shoulder region  . [name]  . Muscle weakness (generalized)    End of Session Activity Tolerance: Patient tolerated treatment well General Behavior During Session: [facility] for tasks performed Cognition: [facility] for tasks performed  GO No functional reporting required  Shirlean Mylar, OTR/L  07/28/2011, 11:23 AM

## 2011-07-30 ENCOUNTER — Ambulatory Visit (HOSPITAL_COMMUNITY)
Admission: RE | Admit: 2011-07-30 | Discharge: 2011-07-30 | Disposition: A | Discharge status: Home / Self Care | Payer: PRIVATE HEALTH INSURANCE | Source: Ambulatory Visit | Attending: Pulmonary Disease | Admitting: Pulmonary Disease

## 2011-07-30 DIAGNOSIS — S43439A Superior glenoid labrum lesion of unspecified shoulder, initial encounter: Secondary | ICD-10-CM

## 2011-07-30 DIAGNOSIS — M25619 Stiffness of unspecified shoulder, not elsewhere classified: Secondary | ICD-10-CM | POA: Insufficient documentation

## 2011-07-30 DIAGNOSIS — M6281 Muscle weakness (generalized): Secondary | ICD-10-CM | POA: Insufficient documentation

## 2011-07-30 DIAGNOSIS — IMO0001 Reserved for inherently not codable concepts without codable children: Secondary | ICD-10-CM | POA: Insufficient documentation

## 2011-07-30 DIAGNOSIS — M25519 Pain in unspecified shoulder: Secondary | ICD-10-CM | POA: Insufficient documentation

## 2011-07-30 NOTE — Progress Notes (Signed)
Occupational Therapy Treatment Patient Details  Name: Gina Vasquez MRN: [id number] Date of Birth: 1990/10/31  Today's Date: 07/30/2011 Time: 8295-6213 OT Time Calculation (min): 48 min Manual Therapy [phone] 23' Therapeutic Exercises [phone] 24' Visit#: 4  of 36   Re-eval: 08/18/11    Subjective Symptoms/Limitations Symptoms: S:  Its been a little bit more sore the last few days, but I know my mobility is improving. Pain Assessment Currently in Pain?: Yes Pain Score:   2 Pain Location: Shoulder Pain Orientation: Left Pain Type: Acute pain  Precautions/Restrictions   avoid combined external rotation and abduction  Exercise/Treatments Supine Protraction: PROM;10 reps;AAROM;15 reps;AROM;5 reps Horizontal ABduction: PROM;10 reps;AAROM;15 reps;AROM;5 reps External Rotation: PROM;10 reps;AAROM;15 reps;AROM;5 reps Internal Rotation: PROM;10 reps;AAROM;15 reps;AROM;5 reps Flexion: PROM;10 reps;AAROM;15 reps;AROM;5 reps ABduction: PROM;10 reps;AAROM;15 reps;AROM;5 reps Seated Elevation: 15 reps Extension: 15 reps Row: 15 reps Therapy Ball Flexion: 15 reps ABduction: 15 reps ROM / Strengthening / Isometric Strengthening Wall Wash: 1' Thumb Tacks: 1' Prot/Ret//Elev/Dep: '      Manual Therapy Manual Therapy: Myofascial release Myofascial Release: MFR and manual stretching to left scapular region, pectoral region, and upper arm to decrease pain and increase ROM.  [phone]  Occupational Therapy Assessment and Plan OT Assessment and Plan Clinical Impression Statement: A:  Continues to increase PROM.  Added AROM in supine. OT Plan: P:  Increase to 10 reps of AROM, add pulleys.   Goals Short Term Goals Time to Complete Short Term Goals: Other (comment) (6 weeks) Short Term Goal 1: Patient will be educated on HEP. Short Term Goal 2: Patient will increase left shoulder PROM to [facility] for increased ability to put her hair in a ponytail. Short Term Goal 3: Patient will  increase left shoulder strength to 3+/5 for increased ability to lift items into her closet. Short Term Goal 4: Patient will decrease pain in her left shoulder region to 2/10 while combing her hair. Short Term Goal 5: Patient will decrease fascial restrictions from mod-max to moderate in her left shoulder region. Long Term Goals Time to Complete Long Term Goals: 12 weeks Long Term Goal 1: Patient will return to prior level of I with all B/IADLs, work, and leisure activities.   Long Term Goal 2: Patient will increase left shoulder AROM to WNL for increased ability to put hair in a ponytail and reach into overhead cabinets. Long Term Goal 3: Patient will increase left shoulder strength to 5/5 for increased ability to lift patients at work. Long Term Goal 4: Patient will decrease pain in left shoulder to 1/10 when completing work activities. Long Term Goal 5: Patient will decrease fascial restrictions to minimal in her left shoulder region.  Problem List Patient Active Problem List  Diagnoses  . SCIATICA  . BACK PAIN  . BACK PAIN WITH RADICULOPATHY  . Pain in joint, shoulder region  . [name]  . Muscle weakness (generalized)    End of Session Activity Tolerance: Patient tolerated treatment well General Behavior During Session: [facility] for tasks performed Cognition: [facility] for tasks performed OT Plan of Care OT Patient Instructions: Reviewed precautions of avoiding combined external rotation and abduction.  GO No functional reporting required  Shirlean Mylar, OTR/L  07/30/2011, 10:42 AM

## 2011-07-31 ENCOUNTER — Ambulatory Visit (HOSPITAL_COMMUNITY)
Admission: RE | Admit: 2011-07-31 | Discharge: 2011-07-31 | Disposition: A | Discharge status: Home / Self Care | Payer: PRIVATE HEALTH INSURANCE | Source: Ambulatory Visit | Attending: Pulmonary Disease | Admitting: Pulmonary Disease

## 2011-07-31 DIAGNOSIS — M6281 Muscle weakness (generalized): Secondary | ICD-10-CM

## 2011-07-31 DIAGNOSIS — S43439A Superior glenoid labrum lesion of unspecified shoulder, initial encounter: Secondary | ICD-10-CM

## 2011-07-31 DIAGNOSIS — M25519 Pain in unspecified shoulder: Secondary | ICD-10-CM

## 2011-07-31 NOTE — Progress Notes (Signed)
Occupational Therapy Treatment Patient Details  Name: Gina Vasquez MRN: [id number] Date of Birth: 09-06-1990  Today's Date: 07/31/2011 Time: 5284-1324 OT Time Calculation (min): 67 min Manual Therapy 4010-2725 27' Therapeutic Exercises [phone] 40' Visit#: 5  of 36   Re-eval: 08/18/11     Subjective Symptoms/Limitations Symptoms: S:  This doesnt hurt as bad today (ER). Pain Assessment Currently in Pain?: Yes Pain Score:   2 Pain Location: Shoulder Pain Orientation: Left Pain Type: Acute pain  Precautions/Restrictions   no er and abd combined  Exercise/Treatments Supine Protraction: PROM;AROM;10 reps;AAROM;15 reps Horizontal ABduction: PROM;AROM;10 reps;AAROM;15 reps External Rotation: PROM;AROM;10 reps;AAROM;15 reps Internal Rotation: PROM;AROM;10 reps;AAROM;15 reps Flexion: PROM;AROM;10 reps;AAROM;15 reps ABduction: PROM;AROM;10 reps;AAROM;15 reps Seated Elevation: 15 reps Extension: 15 reps Row: 15 reps Pulleys Flexion: 2 minutes ABduction: 2 minutes Therapy Ball Flexion: 20 reps ABduction: 20 reps ROM / Strengthening / Isometric Strengthening Wall Wash: 1' Thumb Tacks: 1' Prot/Ret//Elev/Dep: 1'      Manual Therapy Manual Therapy: Myofascial release Myofascial Release: MFR and manual stretching to left scapular region, pectoral region, and upper arm to decrease pain and increase PROM.  7425-9563  Occupational Therapy Assessment and Plan OT Assessment and Plan Clinical Impression Statement: A:  Increased I with AROM in supine. OT Plan: P:  Attempt AROM in seated as patient tolerates.   Goals Short Term Goals Time to Complete Short Term Goals: Other (comment) (6 weeks) Short Term Goal 1: Patient will be educated on HEP. Short Term Goal 2: Patient will increase left shoulder PROM to [facility] for increased ability to put her hair in a ponytail. Short Term Goal 3: Patient will increase left shoulder strength to 3+/5 for increased ability to lift items into  her closet. Short Term Goal 4: Patient will decrease pain in her left shoulder region to 2/10 while combing her hair. Short Term Goal 5: Patient will decrease fascial restrictions from mod-max to moderate in her left shoulder region. Long Term Goals Time to Complete Long Term Goals: 12 weeks Long Term Goal 1: Patient will return to prior level of I with all B/IADLs, work, and leisure activities.   Long Term Goal 2: Patient will increase left shoulder AROM to WNL for increased ability to put hair in a ponytail and reach into overhead cabinets. Long Term Goal 3: Patient will increase left shoulder strength to 5/5 for increased ability to lift patients at work. Long Term Goal 4: Patient will decrease pain in left shoulder to 1/10 when completing work activities. Long Term Goal 5: Patient will decrease fascial restrictions to minimal in her left shoulder region.  Problem List Patient Active Problem List  Diagnoses  . SCIATICA  . BACK PAIN  . BACK PAIN WITH RADICULOPATHY  . Pain in joint, shoulder region  . [name]  . Muscle weakness (generalized)    End of Session Activity Tolerance: Patient tolerated treatment well General Behavior During Session: [facility] for tasks performed Cognition: [facility] for tasks performed  GO No functional reporting required  Shirlean Mylar, OTR/L  07/31/2011, 12:02 PM

## 2011-08-03 ENCOUNTER — Ambulatory Visit (HOSPITAL_COMMUNITY)
Admission: RE | Admit: 2011-08-03 | Discharge: 2011-08-03 | Disposition: A | Discharge status: Home / Self Care | Payer: PRIVATE HEALTH INSURANCE | Source: Ambulatory Visit | Attending: Occupational Therapy | Admitting: Occupational Therapy

## 2011-08-03 DIAGNOSIS — S43439A Superior glenoid labrum lesion of unspecified shoulder, initial encounter: Secondary | ICD-10-CM

## 2011-08-03 DIAGNOSIS — M25519 Pain in unspecified shoulder: Secondary | ICD-10-CM

## 2011-08-03 DIAGNOSIS — M6281 Muscle weakness (generalized): Secondary | ICD-10-CM

## 2011-08-03 NOTE — Progress Notes (Signed)
Occupational Therapy Treatment Patient Details  Name: CLOTILDE LOTH MRN: [id number] Date of Birth: 06/05/1990  Today's Date: 08/03/2011 Time: 4098-1191 OT Time Calculation (min): 47 min Manual Therapy 4782-9562 29' Therapeutic Exercise 1040-1157 17'  Visit#: 6  of 36   Re-eval: 08/18/11   Subjective Symptoms/Limitations Symptoms: S:  It is slowly getting better. Pain Assessment Currently in Pain?: Yes Pain Score:   4 Pain Location: Shoulder Pain Orientation: Left Pain Type: Acute pain  Precautions/Restrictions     Exercise/Treatments Supine Protraction: PROM;AROM;12 reps Horizontal ABduction: PROM;AROM;12 reps External Rotation: PROM;AROM;12 reps Internal Rotation: PROM;AROM;12 reps Flexion: PROM;AROM;12 reps ABduction: PROM;AROM;12 reps Seated Elevation: 15 reps Extension: 15 reps Row: 15 reps Protraction: AROM;10 reps Horizontal ABduction: AROM;10 reps External Rotation: AROM;10 reps Internal Rotation: AROM;10 reps Flexion: AROM;10 reps Abduction: AROM;10 reps Pulleys Flexion: 2 minutes ABduction: 2 minutes Therapy Ball Flexion: 20 reps ABduction: 20 reps ROM / Strengthening / Isometric Strengthening Wall Wash: 2' Thumb Tacks: 1' Prot/Ret//Elev/Dep: 1'      Manual Therapy Manual Therapy: Myofascial release Myofascial Release: MFR and manual stretching to left upper arm and scapular region to decrease pain and restrictions and increase A/PROM to [facility] in pain free range  Occupational Therapy Assessment and Plan OT Assessment and Plan Clinical Impression Statement: A:  Assistance not needed with supine AROM today.  Added AROM in seated position. OT Plan: P:  Increase reps.   Goals Short Term Goals Time to Complete Short Term Goals: Other (comment) (6 weeks) Short Term Goal 1: Patient will be educated on HEP. Short Term Goal 2: Patient will increase left shoulder PROM to [facility] for increased ability to put her hair in a ponytail. Short Term Goal 3:  Patient will increase left shoulder strength to 3+/5 for increased ability to lift items into her closet. Short Term Goal 4: Patient will decrease pain in her left shoulder region to 2/10 while combing her hair. Short Term Goal 5: Patient will decrease fascial restrictions from mod-max to moderate in her left shoulder region. Long Term Goals Time to Complete Long Term Goals: 12 weeks Long Term Goal 1: Patient will return to prior level of I with all B/IADLs, work, and leisure activities.   Long Term Goal 2: Patient will increase left shoulder AROM to WNL for increased ability to put hair in a ponytail and reach into overhead cabinets. Long Term Goal 3: Patient will increase left shoulder strength to 5/5 for increased ability to lift patients at work. Long Term Goal 4: Patient will decrease pain in left shoulder to 1/10 when completing work activities. Long Term Goal 5: Patient will decrease fascial restrictions to minimal in her left shoulder region.  Problem List Patient Active Problem List  Diagnoses  . SCIATICA  . BACK PAIN  . BACK PAIN WITH RADICULOPATHY  . Pain in joint, shoulder region  . [name]  . Muscle weakness (generalized)    End of Session Activity Tolerance: Patient tolerated treatment well General Behavior During Session: [facility] for tasks performed Cognition: [facility] for tasks performed  GO No functional reporting required   Keyasia Jolliff L. Dollene Mallery, COTA/L  08/03/2011, 4:48 PM

## 2011-08-05 ENCOUNTER — Ambulatory Visit (HOSPITAL_COMMUNITY)
Admission: RE | Admit: 2011-08-05 | Discharge: 2011-08-05 | Disposition: A | Discharge status: Home / Self Care | Payer: PRIVATE HEALTH INSURANCE | Source: Ambulatory Visit | Attending: Pulmonary Disease | Admitting: Pulmonary Disease

## 2011-08-05 DIAGNOSIS — S43439A Superior glenoid labrum lesion of unspecified shoulder, initial encounter: Secondary | ICD-10-CM

## 2011-08-05 DIAGNOSIS — M6281 Muscle weakness (generalized): Secondary | ICD-10-CM

## 2011-08-05 DIAGNOSIS — M25519 Pain in unspecified shoulder: Secondary | ICD-10-CM

## 2011-08-05 NOTE — Progress Notes (Signed)
Occupational Therapy Treatment Patient Details  Name: Gina Vasquez MRN: [id number] Date of Birth: [birth year]  Today's Date: 08/05/2011 Time: 4098-1191 OT Time Calculation (min): 56 min Manual Therapy [phone] 29' Therapeutic Exercise [id number] 26'  Visit#: 7  of 36   Re-eval: 08/18/11 Assessment Diagnosis: Left Shoulder Scope with [employer] Surgical Date: 05/28/11 Next MD Visit: 08/17/11   Subjective Symptoms/Limitations Symptoms: S: It is just stiff Pain Assessment Currently in Pain?: Yes Pain Score:   2 Pain Location: Shoulder Pain Orientation: Left  Precautions/Restrictions     Exercise/Treatments Supine Protraction: PROM;AROM;15 reps Horizontal ABduction: PROM;AROM;15 reps External Rotation: PROM;AROM;15 reps Internal Rotation: PROM;AROM;15 reps Flexion: PROM;AROM;15 reps ABduction: PROM;AROM;15 reps Seated Elevation: 15 reps Extension: 15 reps Row: 15 reps Protraction: AROM;12 reps Horizontal ABduction: AROM;12 reps External Rotation: AROM;12 reps Internal Rotation: AROM;12 reps Flexion: AROM;12 reps Abduction: AROM;12 reps Pulleys Flexion: 2 minutes ABduction: 2 minutes Therapy Ball Flexion: 20 reps ABduction: 20 reps ROM / Strengthening / Isometric Strengthening Wall Wash: 3' Thumb Tacks: 1 Prot/Ret//Elev/Dep: 1'      Manual Therapy Manual Therapy: Myofascial release Myofascial Release: MFR and manual stretching to left upper arm and scapular region to decrease pain and restrictions and increase A/PROM to [facility] in pain free range   Occupational Therapy Assessment and Plan OT Assessment and Plan Clinical Impression Statement: A:  Increased time with wall wash. Rehab Potential: Excellent OT Plan: P:  Add tband for scapula strengthening, add 1# to supine AROM, d/c pullies.    Goals Short Term Goals Time to Complete Short Term Goals: Other (comment) (6 weeks) Short Term Goal 1: Patient will be educated on HEP. Short Term Goal 2:  Patient will increase left shoulder PROM to [facility] for increased ability to put her hair in a ponytail. Short Term Goal 3: Patient will increase left shoulder strength to 3+/5 for increased ability to lift items into her closet. Short Term Goal 4: Patient will decrease pain in her left shoulder region to 2/10 while combing her hair. Short Term Goal 5: Patient will decrease fascial restrictions from mod-max to moderate in her left shoulder region. Long Term Goals Time to Complete Long Term Goals: 12 weeks Long Term Goal 1: Patient will return to prior level of I with all B/IADLs, work, and leisure activities.   Long Term Goal 2: Patient will increase left shoulder AROM to WNL for increased ability to put hair in a ponytail and reach into overhead cabinets. Long Term Goal 3: Patient will increase left shoulder strength to 5/5 for increased ability to lift patients at work. Long Term Goal 4: Patient will decrease pain in left shoulder to 1/10 when completing work activities. Long Term Goal 5: Patient will decrease fascial restrictions to minimal in her left shoulder region.  Problem List Patient Active Problem List  Diagnoses  . SCIATICA  . BACK PAIN  . BACK PAIN WITH RADICULOPATHY  . Pain in joint, shoulder region  . [name]  . Muscle weakness (generalized)    End of Session Activity Tolerance: Patient tolerated treatment well General Behavior During Session: [facility] for tasks performed Cognition: [facility] for tasks performed  GO No functional reporting required   Yuriko Portales L. Carys Malina, COTA/L  08/05/2011, 12:00 PM

## 2011-08-06 ENCOUNTER — Ambulatory Visit (HOSPITAL_COMMUNITY)
Admission: RE | Admit: 2011-08-06 | Discharge: 2011-08-06 | Disposition: A | Discharge status: Home / Self Care | Payer: PRIVATE HEALTH INSURANCE | Source: Ambulatory Visit | Attending: Pulmonary Disease | Admitting: Pulmonary Disease

## 2011-08-06 DIAGNOSIS — M25519 Pain in unspecified shoulder: Secondary | ICD-10-CM

## 2011-08-06 DIAGNOSIS — M6281 Muscle weakness (generalized): Secondary | ICD-10-CM

## 2011-08-06 DIAGNOSIS — S43439A Superior glenoid labrum lesion of unspecified shoulder, initial encounter: Secondary | ICD-10-CM

## 2011-08-06 NOTE — Progress Notes (Signed)
Occupational Therapy Treatment Patient Details  Name: Gina Vasquez MRN: [id number] Date of Birth: [birth year]  Today's Date: 08/06/2011 Time: 7829-5621 OT Time Calculation (min): 62 min Manual Therapy [phone] 30' Therapeutic Exercises [phone] 25' Visit#: 8  of 36   Re-eval: 08/18/11     Subjective Symptoms/Limitations Symptoms: S:  I feel like I can do a little more with it. Pain Assessment Currently in Pain?: Yes Pain Score:   2 Pain Location: Shoulder Pain Orientation: Left Pain Type: Acute pain  Precautions/Restrictions   No combine ext rot and abd  Exercise/Treatments Supine Protraction: PROM;AROM;15 reps Horizontal ABduction: PROM;AROM;15 reps External Rotation: PROM;AROM;15 reps Internal Rotation: PROM;AROM;15 reps Flexion: PROM;AROM;15 reps ABduction: PROM;AROM;15 reps Other Supine Exercises: focused abd with shoulder in body plane x 10 Other Supine Exercises: serratus anterior punch x 15 Seated Elevation: 15 reps Extension: Theraband;15 reps (red) Retraction: Theraband;15 reps (red) Row: Theraband;15 reps (red) Protraction: AROM;12 reps Horizontal ABduction: AROM;12 reps External Rotation: AROM;12 reps Internal Rotation: AROM;12 reps Flexion: AROM;12 reps Abduction: AROM;12 reps Pulleys Flexion:  (dc) ABduction:  (dc) Therapy Ball Flexion: 25 reps ABduction: 25 reps Right/Left: 5 reps ROM / Strengthening / Isometric Strengthening Wall Wash: 3' Thumb Tacks: 1 Prot/Ret//Elev/Dep: 1'      Manual Therapy Manual Therapy: Myofascial release Myofascial Release: MFR and manual stretching to left upper arm, scapular, and anterior shoulder region to decrease pain and fascial restrictions.  [phone]  Occupational Therapy Assessment and Plan OT Assessment and Plan Clinical Impression Statement: A:  Added abduction in body plane, added serratus anterior punch in supine.  Focused on maintaining adduction of shoulder during ext rotation. OT Plan: P:   Once full AROM with flexion and abduction achieved, add weight to supine exercises.   Goals Short Term Goals Time to Complete Short Term Goals: Other (comment) (6 weeks) Short Term Goal 1: Patient will be educated on HEP. Short Term Goal 1 Progress: Progressing toward goal Short Term Goal 2: Patient will increase left shoulder PROM to [facility] for increased ability to put her hair in a ponytail. Short Term Goal 2 Progress: Progressing toward goal Short Term Goal 3: Patient will increase left shoulder strength to 3+/5 for increased ability to lift items into her closet. Short Term Goal 3 Progress: Progressing toward goal Short Term Goal 4: Patient will decrease pain in her left shoulder region to 2/10 while combing her hair. Short Term Goal 4 Progress: Progressing toward goal Short Term Goal 5: Patient will decrease fascial restrictions from mod-max to moderate in her left shoulder region. Short Term Goal 5 Progress: Progressing toward goal Long Term Goals Time to Complete Long Term Goals: 12 weeks Long Term Goal 1: Patient will return to prior level of I with all B/IADLs, work, and leisure activities.   Long Term Goal 1 Progress: Progressing toward goal Long Term Goal 2: Patient will increase left shoulder AROM to WNL for increased ability to put hair in a ponytail and reach into overhead cabinets. Long Term Goal 2 Progress: Progressing toward goal Long Term Goal 3: Patient will increase left shoulder strength to 5/5 for increased ability to lift patients at work. Long Term Goal 3 Progress: Progressing toward goal Long Term Goal 4: Patient will decrease pain in left shoulder to 1/10 when completing work activities. Long Term Goal 4 Progress: Progressing toward goal Long Term Goal 5: Patient will decrease fascial restrictions to minimal in her left shoulder region. Long Term Goal 5 Progress: Progressing toward goal  Problem List Patient Active Problem  List  Diagnoses  . SCIATICA  . BACK  PAIN  . BACK PAIN WITH RADICULOPATHY  . Pain in joint, shoulder region  . [name]  . Muscle weakness (generalized)    End of Session Activity Tolerance: Patient tolerated treatment well General Behavior During Session: [facility] for tasks performed OT Plan of Care OT Home Exercise Plan: Added holding arm in horizontal abduction while in supine and allowing for gentle stretch.  GO No functional reporting required  Shirlean Mylar, OTR/L  08/06/2011, 11:31 AM

## 2011-08-11 ENCOUNTER — Ambulatory Visit (HOSPITAL_COMMUNITY)
Admission: RE | Admit: 2011-08-11 | Discharge: 2011-08-11 | Disposition: A | Discharge status: Home / Self Care | Payer: PRIVATE HEALTH INSURANCE | Source: Ambulatory Visit | Attending: Pulmonary Disease | Admitting: Pulmonary Disease

## 2011-08-11 DIAGNOSIS — S43439A Superior glenoid labrum lesion of unspecified shoulder, initial encounter: Secondary | ICD-10-CM

## 2011-08-11 DIAGNOSIS — M6281 Muscle weakness (generalized): Secondary | ICD-10-CM

## 2011-08-11 DIAGNOSIS — M25519 Pain in unspecified shoulder: Secondary | ICD-10-CM

## 2011-08-11 NOTE — Progress Notes (Signed)
Occupational Therapy Treatment Patient Details  Name: Gina Vasquez MRN: [id number] Date of Birth: Sep 29, 1990  Today's Date: 08/11/2011 Time: 4098-1191 OT Time Calculation (min): 52 min Manual Therapy [phone] 31' Therapeutic Exercise [id number] 20'  Visit#: 9  of 36   Re-eval: 08/18/11 Assessment Diagnosis: Left Shoulder Scope with [employer] Surgical Date: 05/28/11 Next MD Visit: 08/17/11   Subjective Symptoms/Limitations Symptoms: S:  I feel stiff, I wasn't too good about my stretches. Pain Assessment Currently in Pain?: No/denies  Precautions/Restrictions     Exercise/Treatments Supine Protraction: PROM;AROM;15 reps Horizontal ABduction: PROM;AROM;15 reps External Rotation: PROM;AROM;15 reps Internal Rotation: PROM;AROM;15 reps Flexion: PROM;AROM;15 reps ABduction: PROM;AROM;15 reps Seated Extension:  (resume next session) Protraction: AROM;15 reps Horizontal ABduction: AROM;15 reps External Rotation: AROM;15 reps Internal Rotation: AROM;15 reps Flexion: AROM;15 reps Abduction: AROM;15 reps Therapy Ball Flexion: 25 reps ABduction: 25 reps Right/Left: 5 reps         Manual Therapy Manual Therapy: Myofascial release Myofascial Release: MFR and manual stretching to left upper arm, scapular, and anterior shoulder region to decrease pain and fascial restrictions  Occupational Therapy Assessment and Plan OT Assessment and Plan Clinical Impression Statement: A;  Added x to v and w-arms. OT Plan: P: Once full AROM with flexion and abduction achieved, add weight to supine exercises   Goals Short Term Goals Time to Complete Short Term Goals: Other (comment) (6 weeks) Short Term Goal 1: Patient will be educated on HEP. Short Term Goal 2: Patient will increase left shoulder PROM to [facility] for increased ability to put her hair in a ponytail. Short Term Goal 3: Patient will increase left shoulder strength to 3+/5 for increased ability to lift items into her  closet. Short Term Goal 4: Patient will decrease pain in her left shoulder region to 2/10 while combing her hair. Short Term Goal 5: Patient will decrease fascial restrictions from mod-max to moderate in her left shoulder region. Long Term Goals Time to Complete Long Term Goals: 12 weeks Long Term Goal 1: Patient will return to prior level of I with all B/IADLs, work, and leisure activities.   Long Term Goal 2: Patient will increase left shoulder AROM to WNL for increased ability to put hair in a ponytail and reach into overhead cabinets. Long Term Goal 3: Patient will increase left shoulder strength to 5/5 for increased ability to lift patients at work. Long Term Goal 4: Patient will decrease pain in left shoulder to 1/10 when completing work activities. Long Term Goal 5: Patient will decrease fascial restrictions to minimal in her left shoulder region.  Problem List Patient Active Problem List  Diagnoses  . SCIATICA  . BACK PAIN  . BACK PAIN WITH RADICULOPATHY  . Pain in joint, shoulder region  . [name]  . Muscle weakness (generalized)    End of Session Activity Tolerance: Patient tolerated treatment well General Behavior During Session: [facility] for tasks performed Cognition: [facility] for tasks performed  GO No functional reporting required   Willy Vorce L. Carline Dura, COTA/L  08/11/2011, 12:06 PM

## 2011-08-12 ENCOUNTER — Ambulatory Visit (HOSPITAL_COMMUNITY): Payer: PRIVATE HEALTH INSURANCE | Admitting: Specialist

## 2011-08-14 ENCOUNTER — Ambulatory Visit (HOSPITAL_COMMUNITY): Payer: PRIVATE HEALTH INSURANCE | Admitting: Specialist

## 2011-08-18 ENCOUNTER — Ambulatory Visit (HOSPITAL_COMMUNITY)
Admission: RE | Admit: 2011-08-18 | Discharge: 2011-08-18 | Disposition: A | Discharge status: Home / Self Care | Payer: PRIVATE HEALTH INSURANCE | Source: Ambulatory Visit | Attending: Pulmonary Disease | Admitting: Pulmonary Disease

## 2011-08-18 DIAGNOSIS — S43439A Superior glenoid labrum lesion of unspecified shoulder, initial encounter: Secondary | ICD-10-CM

## 2011-08-18 DIAGNOSIS — M6281 Muscle weakness (generalized): Secondary | ICD-10-CM

## 2011-08-18 DIAGNOSIS — M25519 Pain in unspecified shoulder: Secondary | ICD-10-CM

## 2011-08-18 NOTE — Progress Notes (Signed)
Occupational Therapy Treatment Patient Details  Name: Gina Vasquez MRN: [id number] Date of Birth: 03-24-1991  Today's Date: 08/18/2011 Time: 4098-1191 OT Time Calculation (min): 49 min Manual Therapy [phone] 31' Therapeutic Exercise 1308-6578 17'  Visit#: 10  of 36   Re-eval: 08/18/11 Assessment Diagnosis: Left Shoulder Scope with [employer] Surgical Date: 05/28/11  Subjective Symptoms/Limitations Symptoms: S:  I am sore. Pain Assessment Currently in Pain?: Yes Pain Score:   2 Pain Location: Shoulder Pain Orientation: Left  Precautions/Restrictions     Exercise/Treatments Supine Protraction: PROM;AROM;20 reps Horizontal ABduction: PROM;AROM;20 reps External Rotation: PROM;AROM;15 reps;20 reps Internal Rotation: PROM;AROM;15 reps;20 reps Flexion: PROM;AROM;15 reps;20 reps ABduction: PROM;AROM;20 reps Therapy Ball Flexion: 25 reps ABduction: 25 reps Right/Left: 5 reps ROM / Strengthening / Isometric Strengthening Wall Wash: 3' Thumb Tacks: 1' Prot/Ret//Elev/Dep: 1'      Manual Therapy Manual Therapy: Myofascial release Myofascial Release: MFR and manual stretching to left upper arm, scapular, and anterior shoulder region to decrease pain and fascial restrictions   Occupational Therapy Assessment and Plan OT Assessment and Plan Clinical Impression Statement: A:  Joint capsule very tight and restricted but with soft end feel. Rehab Potential: Excellent OT Plan: P:  Reassess   Goals Short Term Goals Time to Complete Short Term Goals: Other (comment) (6 weeks) Short Term Goal 1: Patient will be educated on HEP. Short Term Goal 2: Patient will increase left shoulder PROM to [facility] for increased ability to put her hair in a ponytail. Short Term Goal 3: Patient will increase left shoulder strength to 3+/5 for increased ability to lift items into her closet. Short Term Goal 4: Patient will decrease pain in her left shoulder region to 2/10 while combing her  hair. Short Term Goal 5: Patient will decrease fascial restrictions from mod-max to moderate in her left shoulder region. Long Term Goals Time to Complete Long Term Goals: 12 weeks Long Term Goal 1: Patient will return to prior level of I with all B/IADLs, work, and leisure activities.   Long Term Goal 2: Patient will increase left shoulder AROM to WNL for increased ability to put hair in a ponytail and reach into overhead cabinets. Long Term Goal 3: Patient will increase left shoulder strength to 5/5 for increased ability to lift patients at work. Long Term Goal 4: Patient will decrease pain in left shoulder to 1/10 when completing work activities. Long Term Goal 5: Patient will decrease fascial restrictions to minimal in her left shoulder region.  Problem List Patient Active Problem List  Diagnoses  . SCIATICA  . BACK PAIN  . BACK PAIN WITH RADICULOPATHY  . Pain in joint, shoulder region  . [name]  . Muscle weakness (generalized)    End of Session Activity Tolerance: Patient tolerated treatment well General Behavior During Session: [facility] for tasks performed Cognition: [facility] for tasks performed  GO No functional reporting required  Ally Knodel L. Tawny Raspberry, COTA/L  08/18/2011, 12:03 PM

## 2011-08-19 ENCOUNTER — Ambulatory Visit (HOSPITAL_COMMUNITY)
Admission: RE | Admit: 2011-08-19 | Discharge: 2011-08-19 | Disposition: A | Discharge status: Home / Self Care | Payer: PRIVATE HEALTH INSURANCE | Source: Ambulatory Visit | Attending: Pulmonary Disease | Admitting: Pulmonary Disease

## 2011-08-19 DIAGNOSIS — M25519 Pain in unspecified shoulder: Secondary | ICD-10-CM

## 2011-08-19 DIAGNOSIS — S43439A Superior glenoid labrum lesion of unspecified shoulder, initial encounter: Secondary | ICD-10-CM

## 2011-08-19 DIAGNOSIS — M6281 Muscle weakness (generalized): Secondary | ICD-10-CM

## 2011-08-19 NOTE — Progress Notes (Signed)
Occupational Therapy Treatment Patient Details  Name: Gina Vasquez MRN: [id number] Date of Birth: 1991/01/24  Today's Date: 08/19/2011 Time: 4098-1191 OT Time Calculation (min): 56 min Manual Therapy [phone] 16' Therapeutic Exercise [id number] 39'  Visit#: 11  of 36   Re-eval: 09/16/11 Assessment Diagnosis: Left Shoulder Scope with [employer] Surgical Date: 05/28/11  Subjective Symptoms/Limitations Symptoms: S:  It is okay, just sore. Pain Assessment Currently in Pain?: Yes Pain Score:   1 Pain Location: Shoulder Pain Orientation: Left Pain Type: Acute pain  Precautions/Restrictions     Exercise/Treatments Supine Protraction: PROM;Strengthening;10 reps Protraction Weight (lbs): 1 Horizontal ABduction: PROM;Strengthening;10 reps Horizontal ABduction Weight (lbs): 1 External Rotation: PROM;Strengthening;10 reps External Rotation Weight (lbs): 1 Internal Rotation: PROM;Strengthening;10 reps Internal Rotation Weight (lbs): 1 Flexion: PROM;Strengthening;10 reps Shoulder Flexion Weight (lbs): 1 ABduction: PROM;Strengthening;10 reps Shoulder ABduction Weight (lbs): 1 Other Supine Exercises: serratus anterior punch x 15 with 1# Seated Extension: Theraband;15 reps Theraband Level (Shoulder Extension): Level 3 (Green) Retraction: Theraband;15 reps Theraband Level (Shoulder Retraction): Level 3 (Green) Row: Theraband;15 reps Theraband Level (Shoulder Row): Level 3 (Green) Protraction: Strengthening;10 reps Protraction Weight (lbs): 1 Horizontal ABduction: Strengthening;10 reps Horizontal ABduction Weight (lbs): 1 External Rotation: Strengthening;10 reps;Theraband;15 reps Theraband Level (Shoulder External Rotation): Level 3 (Green) External Rotation Weight (lbs): 1 Internal Rotation: Strengthening;10 reps;Theraband;15 reps Theraband Level (Shoulder Internal Rotation): Level 3 (Green) Internal Rotation Weight (lbs): 1 Flexion: Strengthening;10 reps Flexion  Weight (lbs): 1 Abduction: Strengthening;10 reps ABduction Weight (lbs): 1 Therapy Ball Flexion: 25 reps ABduction: 25 reps Right/Left: 5 reps ROM / Strengthening / Isometric Strengthening UBE (Upper Arm Bike): 3' forward 3' backwards 1.0 Wall Wash: 2' with 1# Thumb Tacks: 1' "W" Arms: x10 X to V Arms: x10 Prot/Ret//Elev/Dep: 1'       Manual Therapy Manual Therapy: Myofascial release Myofascial Release: MFR and manual stretching to left upper arm, scapular, and anterior shoulder region to decrease pain and fascial restrictions   Occupational Therapy Assessment and Plan OT Assessment and Plan Clinical Impression Statement: A:  Added multiple new ex. which patient tolerated well,  Added 1# to supine, sit and wall wash.  See progress note OT Plan: P:  Continue 3x a week for 4 weeks.   Goals Short Term Goals Time to Complete Short Term Goals: Other (comment) (6 weeks) Short Term Goal 1: Patient will be educated on HEP. Short Term Goal 1 Progress: Met Short Term Goal 2: Patient will increase left shoulder PROM to [facility] for increased ability to put her hair in a ponytail. Short Term Goal 2 Progress: Met Short Term Goal 3: Patient will increase left shoulder strength to 3+/5 for increased ability to lift items into her closet. Short Term Goal 3 Progress: Met Short Term Goal 4: Patient will decrease pain in her left shoulder region to 2/10 while combing her hair. Short Term Goal 4 Progress: Partly met Short Term Goal 5: Patient will decrease fascial restrictions from mod-max to moderate in her left shoulder region. Short Term Goal 5 Progress: Met Long Term Goals Time to Complete Long Term Goals: 12 weeks Long Term Goal 1: Patient will return to prior level of I with all B/IADLs, work, and leisure activities.   Long Term Goal 1 Progress: Progressing toward goal Long Term Goal 2: Patient will increase left shoulder AROM to WNL for increased ability to put hair in a ponytail and reach  into overhead cabinets. Long Term Goal 2 Progress: Progressing toward goal Long Term Goal 3: Patient will increase left  shoulder strength to 5/5 for increased ability to lift patients at work. Long Term Goal 3 Progress: Progressing toward goal Long Term Goal 4: Patient will decrease pain in left shoulder to 1/10 when completing work activities. Long Term Goal 4 Progress: Progressing toward goal Long Term Goal 5: Patient will decrease fascial restrictions to minimal in her left shoulder region. Long Term Goal 5 Progress: Progressing toward goal  Problem List Patient Active Problem List  Diagnoses  . SCIATICA  . BACK PAIN  . BACK PAIN WITH RADICULOPATHY  . Pain in joint, shoulder region  . [name]  . Muscle weakness (generalized)    End of Session Activity Tolerance: Patient tolerated treatment well General Behavior During Session: [facility] for tasks performed Cognition: [facility] for tasks performed  GO No functional reporting required   Jakolby Sedivy L. Caileb Rhue, COTA/L  08/19/2011, 10:34 AM

## 2011-08-21 ENCOUNTER — Ambulatory Visit (HOSPITAL_COMMUNITY)
Admission: RE | Admit: 2011-08-21 | Discharge: 2011-08-21 | Disposition: A | Discharge status: Home / Self Care | Payer: PRIVATE HEALTH INSURANCE | Source: Ambulatory Visit | Attending: Specialist | Admitting: Specialist

## 2011-08-21 ENCOUNTER — Ambulatory Visit (HOSPITAL_COMMUNITY): Payer: PRIVATE HEALTH INSURANCE | Admitting: Specialist

## 2011-08-21 NOTE — Progress Notes (Signed)
Occupational Therapy Treatment Patient Details  Name: Gina Vasquez MRN: [id number] Date of Birth: 08/17/1990  Today's Date: 08/21/2011 Time: 2130-8657 OT Time Calculation (min): 63 min  Manual Therapy: 8469-6295 Therapeutic Exercise: 1013-1055 Visit#: 12  of 36   Re-eval: 09/16/11   Subjective Symptoms/Limitations Symptoms: S:  The last 2 times Gina Vasquez stretched my arm out to the side (abduction and external rotation) and it hurt really bad.  Am I supposed to be doing that movement? Limitations: [name] that this combined movement should be avoided throught 08/20/11 and that going forward should be AROM only, not PROM and not strengthening. Pain Assessment Currently in Pain?: Yes Pain Score:   1 Pain Location: Shoulder Pain Orientation: Left Pain Type: Acute pain  Precautions/Restrictions   No combined external rotation and abduction.  Exercise/Treatments Supine Protraction: PROM;10 reps;Strengthening;12 reps Protraction Weight (lbs): 1 Horizontal ABduction: PROM;10 reps;Strengthening;12 reps Horizontal ABduction Weight (lbs): 1 External Rotation: PROM;10 reps;Strengthening;12 reps External Rotation Weight (lbs): 1 Internal Rotation: PROM;10 reps;Strengthening;12 reps Flexion: PROM;10 reps;Strengthening;12 reps Shoulder Flexion Weight (lbs): 1 ABduction: PROM;10 reps;Strengthening;12 reps Shoulder ABduction Weight (lbs): 1 Other Supine Exercises: resume next visit Seated Extension: Theraband;15 reps Theraband Level (Shoulder Extension): Level 3 (Green) Retraction: Theraband;15 reps Theraband Level (Shoulder Retraction): Level 3 (Green) Row: Theraband;15 reps Theraband Level (Shoulder Row): Level 3 (Green) Protraction: Strengthening;10 reps Protraction Weight (lbs): 1 Horizontal ABduction: Strengthening;10 reps Horizontal ABduction Weight (lbs): 1 External Rotation: Theraband;10 reps (physical assistance provided in initial range) Theraband Level (Shoulder  External Rotation): Level 3 (Green) Internal Rotation: Theraband;10 reps Theraband Level (Shoulder Internal Rotation): Level 3 (Green) Flexion: Strengthening;10 reps Flexion Weight (lbs): 1 Abduction: Strengthening;10 reps ABduction Weight (lbs): 1   Therapy Ball Flexion: 25 reps (dc after this visit) ABduction: 25 reps Right/Left: 5 reps ROM / Strengthening / Isometric Strengthening UBE (Upper Arm Bike): 3' forward 3' backwards 1.0 Wall Wash: 2' with 1# Thumb Tacks: 1' "W" Arms: resume next visit X to V Arms: resume next visit Prot/Ret//Elev/Dep: 1' Other ROM/Strengthening Exercises: throwing exercise with Saebo ball (x10 throws)      Manual Therapy Manual Therapy: Myofascial release Myofascial Release: MFR and manual stretching to left upper arm, scapular, and anterior shoulder region to decrease pain and fascial restrictions.  Stretching of ext rot completed with shoulder adducted.  Subscapularis with increased tightness this date.  [phone]  Occupational Therapy Assessment and Plan OT Assessment and Plan Clinical Impression Statement: Added ball toss exercise for AROM ext/int rotation; patient tolerated well. Patient required verbal cueing for proper positioning during seated strengthening and theraband exercises. UEFI re-administered ; scored 64/80 (80%). OT Plan: Change to red theraband for ext/int rot therband exercise due to patient significant difficulty with green theraband. Continue with ext/int AROM exercise.   Goals Short Term Goals Time to Complete Short Term Goals: Other (comment) (6 weeks) Short Term Goal 1: Patient will be educated on HEP. Short Term Goal 2: Patient will increase left shoulder PROM to [facility] for increased ability to put her hair in a ponytail. Short Term Goal 3: Patient will increase left shoulder strength to 3+/5 for increased ability to lift items into her closet. Short Term Goal 4: Patient will decrease pain in her left shoulder region to 2/10  while combing her hair. Short Term Goal 5: Patient will decrease fascial restrictions from mod-max to moderate in her left shoulder region. Long Term Goals Time to Complete Long Term Goals: 12 weeks Long Term Goal 1: Patient will return to prior level of I with  all B/IADLs, work, and leisure activities.   Long Term Goal 2: Patient will increase left shoulder AROM to WNL for increased ability to put hair in a ponytail and reach into overhead cabinets. Long Term Goal 3: Patient will increase left shoulder strength to 5/5 for increased ability to lift patients at work. Long Term Goal 4: Patient will decrease pain in left shoulder to 1/10 when completing work activities. Long Term Goal 5: Patient will decrease fascial restrictions to minimal in her left shoulder region.  Problem List Patient Active Problem List  Diagnoses  . SCIATICA  . BACK PAIN  . BACK PAIN WITH RADICULOPATHY  . Pain in joint, shoulder region  . [name]  . Muscle weakness (generalized)    End of Session Activity Tolerance: Patient tolerated treatment well General Behavior During Session: [facility] for tasks performed Cognition: [facility] for tasks performed  GO No functional reporting required  Armandina Stammer, OTS Note reviewed by clinical instructor and accurately reflects treatment session. Shirlean Mylar, OTR/L   08/21/2011, 11:08 AM

## 2011-08-26 ENCOUNTER — Ambulatory Visit (HOSPITAL_COMMUNITY): Payer: PRIVATE HEALTH INSURANCE | Admitting: Specialist

## 2011-08-28 ENCOUNTER — Ambulatory Visit (HOSPITAL_COMMUNITY): Payer: PRIVATE HEALTH INSURANCE | Admitting: Specialist

## 2016-03-22 ENCOUNTER — Emergency Department (HOSPITAL_COMMUNITY): Payer: PRIVATE HEALTH INSURANCE

## 2016-03-22 ENCOUNTER — Emergency Department (HOSPITAL_COMMUNITY)
Admission: EM | Admit: 2016-03-22 | Discharge: 2016-03-22 | Disposition: A | Discharge status: Home / Self Care | Payer: PRIVATE HEALTH INSURANCE | Attending: Emergency Medicine | Admitting: Emergency Medicine

## 2016-03-22 ENCOUNTER — Encounter (HOSPITAL_COMMUNITY): Payer: Self-pay | Admitting: Emergency Medicine

## 2016-03-22 DIAGNOSIS — M5136 Other intervertebral disc degeneration, lumbar region: Secondary | ICD-10-CM | POA: Diagnosis not present

## 2016-03-22 DIAGNOSIS — Y9389 Activity, other specified: Secondary | ICD-10-CM | POA: Insufficient documentation

## 2016-03-22 DIAGNOSIS — Y990 Civilian activity done for income or pay: Secondary | ICD-10-CM | POA: Diagnosis not present

## 2016-03-22 DIAGNOSIS — J40 Bronchitis, not specified as acute or chronic: Secondary | ICD-10-CM | POA: Diagnosis not present

## 2016-03-22 DIAGNOSIS — S39012A Strain of muscle, fascia and tendon of lower back, initial encounter: Secondary | ICD-10-CM | POA: Diagnosis not present

## 2016-03-22 DIAGNOSIS — Y929 Unspecified place or not applicable: Secondary | ICD-10-CM | POA: Insufficient documentation

## 2016-03-22 DIAGNOSIS — S3992XA Unspecified injury of lower back, initial encounter: Secondary | ICD-10-CM | POA: Diagnosis present

## 2016-03-22 DIAGNOSIS — X500XXA Overexertion from strenuous movement or load, initial encounter: Secondary | ICD-10-CM | POA: Insufficient documentation

## 2016-03-22 HISTORY — DX: Immune thrombocytopenic purpura: D69.3

## 2016-03-22 LAB — URINALYSIS, ROUTINE W REFLEX MICROSCOPIC
BILIRUBIN URINE: NEGATIVE
Glucose, UA: NEGATIVE mg/dL
Hgb urine dipstick: NEGATIVE
Ketones, ur: NEGATIVE mg/dL
NITRITE: NEGATIVE
PH: 7 (ref 5.0–8.0)
Protein, ur: NEGATIVE mg/dL
SPECIFIC GRAVITY, URINE: 1.015 (ref 1.005–1.030)

## 2016-03-22 LAB — POC URINE PREG, ED: Preg Test, Ur: NEGATIVE

## 2016-03-22 MED ORDER — METHOCARBAMOL 500 MG PO TABS: 500.0000 mg | ORAL_TABLET | Freq: Three times a day (TID) | ORAL | 0 refills | Status: AC

## 2016-03-22 MED ORDER — HYDROCOD POLST-CPM POLST ER 10-8 MG/5ML PO SUER
5.0000 mL | Freq: Once | ORAL | Status: AC
  Administered 2016-03-22: 5 mL via ORAL
  Filled 2016-03-22: qty 5

## 2016-03-22 MED ORDER — DIAZEPAM 5 MG PO TABS
5.0000 mg | ORAL_TABLET | Freq: Once | ORAL | Status: AC
  Administered 2016-03-22: 5 mg via ORAL
  Filled 2016-03-22: qty 1

## 2016-03-22 MED ORDER — IBUPROFEN 800 MG PO TABS
800.0000 mg | ORAL_TABLET | Freq: Once | ORAL | Status: AC
  Administered 2016-03-22: 800 mg via ORAL
  Filled 2016-03-22: qty 1

## 2016-03-22 MED ORDER — IBUPROFEN 600 MG PO TABS: 600.0000 mg | ORAL_TABLET | Freq: Four times a day (QID) | ORAL | 0 refills | Status: AC | PRN

## 2016-03-22 MED ORDER — HYDROCODONE-HOMATROPINE 5-1.5 MG/5ML PO SYRP: 5.0000 mL | ORAL_SOLUTION | Freq: Four times a day (QID) | ORAL | 0 refills | Status: AC | PRN

## 2016-03-22 NOTE — ED Notes (Signed)
Patient given a toothbrush and paste at this time.

## 2016-03-22 NOTE — ED Provider Notes (Signed)
[facility]-EMERGENCY DEPT Provider Note   CSN: [id number] Arrival date & time: 03/22/16  [id number]     History   Chief Complaint Chief Complaint  Patient presents with  . Back Pain    HPI Gina Vasquez is a 25 y.o. female.  Patient states she works as a Engineer, civil (consulting)nurse. Recently she had a very heavy female patient who arrested on the commode. She was involved in helping to assist this patient. It was after that she injured her shoulder, and later noticed pain in her back. The patient also has cough and congestion. She says that each time she coughs she feels as though there is a knife stabbing her in the lower back. She's not had any loss of control of bowel or bladder function. She's not had any frequent or recurrent falls. His been no high fever, and no hemoptysis reported. Patient has not had operations on her back.   The history is provided by the patient.  Back Pain   Chronicity: acute on chronic pain. The current episode started more than 2 days ago. The problem occurs daily. The problem has been gradually worsening. The pain is associated with no known injury. The pain is present in the lumbar spine. The quality of the pain is described as shooting and aching. The pain is moderate. The symptoms are aggravated by certain positions (cough). The pain is the same all the time. Pertinent negatives include no chest pain, no fever, no abdominal pain, no bowel incontinence, no perianal numbness, no bladder incontinence, no dysuria and no tingling. She has tried heat, ice and NSAIDs for the symptoms. The treatment provided no relief.    Past Medical History:  Diagnosis Date  . Chronic ITP (idiopathic thrombocytopenia) (HCC)   . Rotator cuff tear 05/2011   left    Patient Active Problem List   Diagnosis Date Noted  . Pain in joint, shoulder region 07/21/2011  . [name] 07/21/2011  . Muscle weakness (generalized) 07/21/2011  . SCIATICA 04/04/2008  . BACK PAIN 04/04/2008  . BACK PAIN  WITH RADICULOPATHY 04/04/2008    Past Surgical History:  Procedure Laterality Date  . GANGLION CYST EXCISION  07/05/2003   right wrist  . WISDOM TOOTH EXTRACTION      OB History    No data available       Home Medications    Prior to Admission medications   Not on File    Family History History reviewed. No pertinent family history.  Social History Social History  Substance Use Topics  . Smoking status: Never Smoker  . Smokeless tobacco: Never Used  . Alcohol use No     Allergies   Patient has no known allergies.   Review of Systems Review of Systems  Constitutional: Negative for activity change and fever.       All ROS Neg except as noted in HPI  HENT: Negative for nosebleeds.   Eyes: Negative for photophobia and discharge.  Respiratory: Positive for cough. Negative for shortness of breath and wheezing.   Cardiovascular: Negative for chest pain and palpitations.  Gastrointestinal: Negative for abdominal pain, blood in stool and bowel incontinence.  Genitourinary: Negative for bladder incontinence, dysuria, frequency and hematuria.  Musculoskeletal: Positive for back pain. Negative for arthralgias and neck pain.  Skin: Negative.   Neurological: Negative for dizziness, tingling, seizures and speech difficulty.  Psychiatric/Behavioral: Negative for confusion and hallucinations.     Physical Exam Updated Vital Signs BP 140/80 (BP Location: Right Arm)  Pulse 98   Temp 98.7 F (37.1 C) (Oral)   Resp 18   Ht 5\' 7"  (1.702 m)   Wt 71.2 kg   LMP 03/12/2016   SpO2 100%   BMI 24.59 kg/m   Physical Exam   ED Treatments / Results  Labs (all labs ordered are listed, but only abnormal results are displayed) Labs Reviewed - No data to display  EKG  EKG Interpretation None       Radiology No results found.  Procedures Procedures (including critical care time)  Medications Ordered in ED Medications - No data to display   Initial Impression /  Assessment and Plan / ED Course  I have reviewed the triage vital signs and the nursing notes.  Pertinent labs & imaging results that were available during my care of the patient were reviewed by me and considered in my medical decision making (see chart for details).  Clinical Course     Final Clinical Impressions(s) / ED Diagnoses  MDM Vital signs reviewed. The patient initially has pain with deep breathing and with cough.  Chest x-ray is negative for acute problem. Plain x-ray of the lumbar spine is negative for compression fracture or other acute problem. There no gross neurologic deficits. The gait is slow but steady. There is no evidence of caudal equina. The x-ray is negative, suspect bronchitis. I have asked the patient to be reevaluated next week for additional management of her back, and possible MRI. Prescription for Hycodan, ibuprofen, and Robaxin given to the patient.     Final diagnoses:  None    New Prescriptions New Prescriptions   No medications on file     Ivery QualeHobson Ryane Konieczny, PA-C 03/22/16 1349    Donnetta HutchingBrian Cook, MD 03/25/16 1228

## 2016-03-22 NOTE — ED Triage Notes (Signed)
Pt reports has history of same. Pt reports was diagnosed with disc protrusion. Pt denies any known injury, gi/gu symptoms. Pt reports pain increases with coughing.

## 2016-03-22 NOTE — Discharge Instructions (Signed)
Your chest x-ray suggest bronchitis, but no pneumonia. The x-ray of your lumbar spine is negative for compression fracture or other emergent situations. Please alternate heat and ice to the muscle area of your lower back. Use ibuprofen with breakfast, lunch, dinner, and at bedtime. Use Robaxin 3 times daily for spasm pain. Use Hycodan for cough and congestion. Hycodan and Robaxin may cause drowsiness, please do not drive, operate machinery, drink alcohol, or participate in activities requiring concentration when taking either of these medications. Please see your primary physician, or your back specialist as soon as possible for additional evaluation and management of your back issue.

## 2016-03-22 NOTE — ED Notes (Signed)
Pt back from x-ray.
